# Patient Record
Sex: Female | Born: 1965 | Race: Black or African American | Hispanic: No | Marital: Married | Smoking: Never smoker
Health system: Southern US, Community
[De-identification: ages and names within clinical notes are randomized; demographics above are authoritative.]

## PROBLEM LIST (undated history)

## (undated) DIAGNOSIS — I10 Essential (primary) hypertension: Secondary | ICD-10-CM

## (undated) DIAGNOSIS — E785 Hyperlipidemia, unspecified: Secondary | ICD-10-CM

## (undated) DIAGNOSIS — Z8041 Family history of malignant neoplasm of ovary: Secondary | ICD-10-CM

## (undated) DIAGNOSIS — K219 Gastro-esophageal reflux disease without esophagitis: Secondary | ICD-10-CM

## (undated) DIAGNOSIS — Z803 Family history of malignant neoplasm of breast: Secondary | ICD-10-CM

## (undated) DIAGNOSIS — Z800 Family history of malignant neoplasm of digestive organs: Secondary | ICD-10-CM

## (undated) DIAGNOSIS — J45909 Unspecified asthma, uncomplicated: Secondary | ICD-10-CM

## (undated) DIAGNOSIS — D649 Anemia, unspecified: Secondary | ICD-10-CM

## (undated) HISTORY — PX: ABDOMINAL HYSTERECTOMY: SHX81

## (undated) HISTORY — DX: Family history of malignant neoplasm of breast: Z80.3

## (undated) HISTORY — DX: Unspecified asthma, uncomplicated: J45.909

## (undated) HISTORY — PX: BREAST SURGERY: SHX581

## (undated) HISTORY — DX: Hyperlipidemia, unspecified: E78.5

## (undated) HISTORY — DX: Family history of malignant neoplasm of ovary: Z80.41

## (undated) HISTORY — DX: Family history of malignant neoplasm of digestive organs: Z80.0

## (undated) HISTORY — DX: Essential (primary) hypertension: I10

## (undated) HISTORY — DX: Anemia, unspecified: D64.9

## (undated) HISTORY — PX: BREAST LUMPECTOMY: SHX2

## (undated) HISTORY — DX: Gastro-esophageal reflux disease without esophagitis: K21.9

---

## 2017-08-19 LAB — HM MAMMOGRAPHY

## 2018-03-06 ENCOUNTER — Ambulatory Visit: Payer: Self-pay | Admitting: Nurse Practitioner

## 2018-03-13 ENCOUNTER — Ambulatory Visit: Payer: Self-pay | Admitting: Nurse Practitioner

## 2018-03-20 ENCOUNTER — Encounter: Payer: Self-pay | Admitting: Nurse Practitioner

## 2018-03-20 ENCOUNTER — Ambulatory Visit: Payer: BLUE CROSS/BLUE SHIELD | Admitting: Nurse Practitioner

## 2018-03-20 VITALS — BP 128/82 | HR 75 | Temp 98.2°F | Ht 68.43 in | Wt 221.4 lb

## 2018-03-20 DIAGNOSIS — J4540 Moderate persistent asthma, uncomplicated: Secondary | ICD-10-CM

## 2018-03-20 DIAGNOSIS — J4541 Moderate persistent asthma with (acute) exacerbation: Secondary | ICD-10-CM

## 2018-03-20 DIAGNOSIS — J45909 Unspecified asthma, uncomplicated: Secondary | ICD-10-CM | POA: Insufficient documentation

## 2018-03-20 DIAGNOSIS — Z1211 Encounter for screening for malignant neoplasm of colon: Secondary | ICD-10-CM | POA: Diagnosis not present

## 2018-03-20 DIAGNOSIS — Z8041 Family history of malignant neoplasm of ovary: Secondary | ICD-10-CM | POA: Insufficient documentation

## 2018-03-20 DIAGNOSIS — Z90711 Acquired absence of uterus with remaining cervical stump: Secondary | ICD-10-CM | POA: Insufficient documentation

## 2018-03-20 MED ORDER — ALBUTEROL SULFATE HFA 108 (90 BASE) MCG/ACT IN AERS: 1.0000 | INHALATION_SPRAY | Freq: Four times a day (QID) | RESPIRATORY_TRACT | 1 refills | Status: DC | PRN

## 2018-03-20 MED ORDER — METHYLPREDNISOLONE 4 MG PO TBPK: ORAL_TABLET | ORAL | 0 refills | Status: DC

## 2018-03-20 MED ORDER — FLUTICASONE-SALMETEROL 250-50 MCG/DOSE IN AEPB: 1.0000 | INHALATION_SPRAY | Freq: Two times a day (BID) | RESPIRATORY_TRACT | 2 refills | Status: DC

## 2018-03-20 NOTE — Progress Notes (Signed)
Subjective:  Patient ID: Mariah Dean, female    DOB: 1965/02/26  Age: 53 y.o. MRN: [id number]  CC: Establish Care (CPE - July 2019, needs ref to GI for colonoscopy) and Asthma (needs albuterol refill)  Asthma  She complains of chest tightness, difficulty breathing, shortness of breath and wheezing. There is no cough, frequent throat clearing, hemoptysis, hoarse voice or sputum production. This is a chronic problem. The current episode started more than 1 year ago. The problem occurs intermittently. The problem has been waxing and waning. Pertinent negatives include no appetite change, chest pain, dyspnea on exertion, ear congestion, ear pain, fever, headaches, heartburn, malaise/fatigue, myalgias, nasal congestion, orthopnea, PND, postnasal drip, rhinorrhea, sneezing, sore throat, sweats, trouble swallowing or weight loss. Her symptoms are aggravated by pollen and change in weather. Her symptoms are alleviated by beta-agonist. She reports moderate improvement on treatment. Her past medical history is significant for asthma.  Albuterol use: every other day, 2-3times a day symbicort use in past  No GERD. No allergic rhinitis. Diagnosed with asthma as child.  S/p hysterectomy2010, ovaries and cervix still present.  Reviewed past Medical, Social and Family history today.  Outpatient Medications Prior to Visit  Medication Sig Dispense Refill  . albuterol (PROVENTIL HFA;VENTOLIN HFA) 108 (90 Base) MCG/ACT inhaler Inhale into the lungs every 6 (six) hours as needed for wheezing or shortness of breath.     No facility-administered medications prior to visit.     ROS See HPI  Objective:  BP 128/82   Pulse 75   Temp 98.2 F (36.8 C) (Oral)   Ht 5' 8.43" (1.738 m)   Wt 221 lb 6.4 oz (100.4 kg)   SpO2 98%   BMI 33.25 kg/m   BP Readings from Last 3 Encounters:  03/20/18 128/82    Wt Readings from Last 3 Encounters:  03/20/18 221 lb 6.4 oz (100.4 kg)   Peak Flow: 185, 190,  218ml/min  Physical Exam Vitals signs reviewed.  HENT:     Mouth/Throat:     Mouth: Mucous membranes are moist.     Pharynx: No posterior oropharyngeal erythema.  Cardiovascular:     Rate and Rhythm: Normal rate and regular rhythm.     Pulses: Normal pulses.     Heart sounds: Normal heart sounds.  Pulmonary:     Effort: Pulmonary effort is normal.     Breath sounds: Normal breath sounds.  Musculoskeletal:     Right lower leg: No edema.     Left lower leg: No edema.  Neurological:     Mental Status: She is alert and oriented to person, place, and time.     No results found for: WBC, HGB, HCT, PLT, GLUCOSE, CHOL, TRIG, HDL, LDLDIRECT, LDLCALC, ALT, AST, NA, K, CL, CREATININE, BUN, CO2, TSH, PSA, INR, GLUF, HGBA1C, MICROALBUR  Assessment & Plan:   Mariah Dean was seen today for establish care and asthma.  Diagnoses and all orders for this visit:  Moderate persistent asthma with acute exacerbation -     Peak flow meter -     Fluticasone-Salmeterol (ADVAIR DISKUS) 250-50 MCG/DOSE AEPB; Inhale 1 puff into the lungs 2 (two) times daily. Rinse mouth after each use -     albuterol (PROVENTIL HFA;VENTOLIN HFA) 108 (90 Base) MCG/ACT inhaler; Inhale 1-2 puffs into the lungs every 6 (six) hours as needed for wheezing or shortness of breath. -     methylPREDNISolone (MEDROL DOSEPAK) 4 MG TBPK tablet; Take as directed on package  Colon cancer screening -  Ambulatory referral to Gastroenterology   I have changed Mariah Dean's albuterol. I am also having her start on Fluticasone-Salmeterol and methylPREDNISolone.  Meds ordered this encounter  Medications  . Fluticasone-Salmeterol (ADVAIR DISKUS) 250-50 MCG/DOSE AEPB    Sig: Inhale 1 puff into the lungs 2 (two) times daily. Rinse mouth after each use    Dispense:  60 each    Refill:  2    Order Specific Question:   Supervising Provider    Answer:   Dianne Dun [[id number]]  . albuterol (PROVENTIL HFA;VENTOLIN HFA) 108 (90 Base)  MCG/ACT inhaler    Sig: Inhale 1-2 puffs into the lungs every 6 (six) hours as needed for wheezing or shortness of breath.    Dispense:  1 each    Refill:  1    Order Specific Question:   Supervising Provider    Answer:   Dianne Dun [[id number]]  . methylPREDNISolone (MEDROL DOSEPAK) 4 MG TBPK tablet    Sig: Take as directed on package    Dispense:  21 tablet    Refill:  0    Order Specific Question:   Supervising Provider    Answer:   Dianne Dun [[id number]]    Problem List Items Addressed This Visit      Respiratory   Moderate persistent asthma without complication - Primary   Relevant Medications   Fluticasone-Salmeterol (ADVAIR DISKUS) 250-50 MCG/DOSE AEPB   albuterol (PROVENTIL HFA;VENTOLIN HFA) 108 (90 Base) MCG/ACT inhaler   methylPREDNISolone (MEDROL DOSEPAK) 4 MG TBPK tablet    Other Visit Diagnoses    Colon cancer screening       Relevant Orders   Ambulatory referral to Gastroenterology      Follow-up: Return in about 4 weeks (around 04/17/2018) for asthma.  Alysia Penna, NP

## 2018-03-20 NOTE — Patient Instructions (Signed)
Complete oral prednisone prior to starting advair inhaler. Rinse mouth after each use of advair.  Use albuterol as prescribed  You will be contacted to schedule appt with GI for colonoscopy   Asthma Attack Prevention, Adult Although you may not be able to control the fact that you have asthma, you can take actions to prevent episodes of asthma (asthma attacks). These actions include:  Creating a written plan for managing and treating your asthma attacks (asthma action plan).  Monitoring your asthma.  Avoiding things that can irritate your airways or make your asthma symptoms worse (asthma triggers).  Taking your medicines as directed.  Acting quickly if you have signs or symptoms of an asthma attack. What are some ways to prevent an asthma attack? Create a plan Work with your health care provider to create an asthma action plan. This plan should include:  A list of your asthma triggers and how to avoid them.  A list of symptoms that you experience during an asthma attack.  Information about when to take medicine and how much medicine to take.  Information to help you understand your peak flow measurements.  Contact information for your health care providers.  Daily actions that you can take to control asthma. Monitor your asthma To monitor your asthma:  Use your peak flow meter every morning and every evening for 2-3 weeks. Record the results in a journal. A drop in your peak flow numbers on one or more days may mean that you are starting to have an asthma attack, even if you are not having symptoms.  When you have asthma symptoms, write them down in a journal.  Avoid asthma triggers Work with your health care provider to find out what your asthma triggers are. This can be done by:  Being tested for allergies.  Keeping a journal that notes when asthma attacks occur and what may have contributed to them.  Asking your health care provider whether other medical  conditions make your asthma worse. Common asthma triggers include:  Dust.  Smoke. This includes campfire smoke and secondhand smoke from tobacco products.  Pet dander.  Trees, grasses or pollens.  Very cold, dry, or humid air.  Mold.  Foods that contain high amounts of sulfites.  Strong smells.  Engine exhaust and air pollution.  Aerosol sprays and fumes from household cleaners.  Household pests and their droppings, including dust mites and cockroaches.  Certain medicines, including NSAIDs. Once you have determined your asthma triggers, take steps to avoid them. Depending on your triggers, you may be able to reduce the chance of an asthma attack by:  Keeping your home clean. Have someone dust and vacuum your home for you 1 or 2 times a week. If possible, have them use a high-efficiency particulate arrestance (HEPA) vacuum.  Washing your sheets weekly in hot water.  Using allergy-proof mattress covers and casings on your bed.  Keeping pets out of your home.  Taking care of mold and water problems in your home.  Avoiding areas where people smoke.  Avoiding using strong perfumes or odor sprays.  Avoid spending a lot of time outdoors when pollen counts are high and on very windy days.  Talking with your health care provider before stopping or starting any new medicines. Medicines Take over-the-counter and prescription medicines only as told by your health care provider. Many asthma attacks can be prevented by carefully following your medicine schedule. Taking your medicines correctly is especially important when you cannot avoid certain asthma triggers.  Even if you are doing well, do not stop taking your medicine and do not take less medicine. Act quickly If an asthma attack happens, acting quickly can decrease how severe it is and how long it lasts. Take these actions:  Pay attention to your symptoms. If you are coughing, wheezing, or having difficulty breathing, do not  wait to see if your symptoms go away on their own. Follow your asthma action plan.  If you have followed your asthma action plan and your symptoms are not improving, call your health care provider or seek immediate medical care at the nearest hospital. It is important to write down how often you need to use your fast-acting rescue inhaler. You can track how often you use an inhaler in your journal. If you are using your rescue inhaler more often, it may mean that your asthma is not under control. Adjusting your asthma treatment plan may help you to prevent future asthma attacks and help you to gain better control of your condition. How can I prevent an asthma attack when I exercise? Exercise is a common asthma trigger. To prevent asthma attacks during exercise:  Follow advice from your health care provider about whether you should use your fast-acting inhaler before exercising. Many people with asthma experience exercise-induced bronchoconstriction (EIB). This condition often worsens during vigorous exercise in cold, humid, or dry environments. Usually, people with EIB can stay very active by using a fast-acting inhaler before exercising.  Avoid exercising outdoors in very cold or humid weather.  Avoid exercising outdoors when pollen counts are high.  Warm up and cool down when exercising.  Stop exercising right away if asthma symptoms start. Consider taking part in exercises that are less likely to cause asthma symptoms such as:  Indoor swimming.  Biking.  Walking.  Hiking.  Playing football. This information is not intended to replace advice given to you by your health care provider. Make sure you discuss any questions you have with your health care provider. Document Released: 12/19/2008 Document Revised: 09/01/2015 Document Reviewed: 06/17/2015 Elsevier Interactive Patient Education  2019 [employer].

## 2018-03-23 ENCOUNTER — Encounter: Payer: Self-pay | Admitting: Nurse Practitioner

## 2018-03-23 NOTE — Progress Notes (Signed)
Abstracted result and sent to scan  

## 2018-04-14 ENCOUNTER — Encounter (INDEPENDENT_AMBULATORY_CARE_PROVIDER_SITE_OTHER): Payer: BLUE CROSS/BLUE SHIELD | Admitting: Nurse Practitioner

## 2018-04-14 DIAGNOSIS — J4540 Moderate persistent asthma, uncomplicated: Secondary | ICD-10-CM

## 2018-04-15 MED ORDER — CETIRIZINE HCL 10 MG PO TABS: 10.0000 mg | ORAL_TABLET | Freq: Every day | ORAL | 0 refills | Status: DC

## 2018-04-15 NOTE — Telephone Encounter (Signed)
Cumulative time during 7-day interval: , there was not an associated office visit for this concern within a 7 day period. Patient did provide consent for services prior to services given. Names of all persons present for services: Alysia Penna, NP, Chief complaint: asthma exacerbation History, background, results pertinent:  Past Medical History:  Diagnosis Date  . Anemia   . Hyperlipidemia   . Hypertension    A/P/next steps:  Diagnoses and all orders for this visit:  Moderate persistent asthma without complication -     cetirizine (ZYRTEC) 10 MG tablet; Take 1 tablet (10 mg total) by mouth daily.   add zyrtec to use of advair and albuterol.

## 2018-04-30 ENCOUNTER — Ambulatory Visit: Payer: BLUE CROSS/BLUE SHIELD | Admitting: Nurse Practitioner

## 2018-05-05 ENCOUNTER — Telehealth: Payer: Self-pay | Admitting: Nurse Practitioner

## 2018-05-05 NOTE — Telephone Encounter (Signed)
Mariah Dean please advise, ov with you first?   Left vm for the pt to call back.     Copied from CRM [id number]. Topic: Quick Communication - See Telephone Encounter >> May 05, 2018 11:10 AM Mariah Dean wrote: CRM for notification. See Telephone encounter for: 05/05/18. [employer] is requiring staff wear mask for the next 14 days/ She has to work/ but with her asthma she can not wear it, using inhaler constantly/ the LOA company (Mariah Dean) with Mariah Dean has given her papers for her provider to process and affirm that she needs to be taken out of work for the next 14 days. Pt is faxing this to Dr Mariah Dean today 4/21 to office fax.

## 2018-05-06 ENCOUNTER — Encounter: Payer: Self-pay | Admitting: Nurse Practitioner

## 2018-05-06 ENCOUNTER — Ambulatory Visit (INDEPENDENT_AMBULATORY_CARE_PROVIDER_SITE_OTHER): Payer: BLUE CROSS/BLUE SHIELD | Admitting: Nurse Practitioner

## 2018-05-06 VITALS — BP 140/86 | Temp 97.0°F | Ht 68.43 in | Wt 230.0 lb

## 2018-05-06 DIAGNOSIS — R030 Elevated blood-pressure reading, without diagnosis of hypertension: Secondary | ICD-10-CM

## 2018-05-06 DIAGNOSIS — J4531 Mild persistent asthma with (acute) exacerbation: Secondary | ICD-10-CM | POA: Diagnosis not present

## 2018-05-06 MED ORDER — MONTELUKAST SODIUM 10 MG PO TABS: 10.0000 mg | ORAL_TABLET | Freq: Every day | ORAL | 3 refills | Status: DC

## 2018-05-06 MED ORDER — FLUTICASONE-SALMETEROL 250-50 MCG/DOSE IN AEPB: 2.0000 | INHALATION_SPRAY | Freq: Two times a day (BID) | RESPIRATORY_TRACT | 2 refills | Status: DC

## 2018-05-06 MED ORDER — FLUTICASONE-SALMETEROL 500-50 MCG/DOSE IN AEPB: 1.0000 | INHALATION_SPRAY | Freq: Two times a day (BID) | RESPIRATORY_TRACT | 2 refills | Status: DC

## 2018-05-06 NOTE — Telephone Encounter (Signed)
Patient calling back to check status. She is requesting a call back.

## 2018-05-06 NOTE — Progress Notes (Signed)
Virtual Visit via Video Note  I connected with Mariah Dean on 05/06/18 at  3:00 PM EDT by a video enabled telemedicine application and verified that I am speaking with the correct person using two identifiers.   I discussed the limitations of evaluation and management by telemedicine and the availability of in person appointments. The patient expressed understanding and agreed to proceed.  CC: LOA paper work consult--pt has to weare mask at work for 14 days (mandatory) but pt is unable to do so due to her Ashthma. got the form in the office.   Asthma: pt mention get SOB on exertion--going on for a while but it is getting worse in the last month--FYI--pt will recheck her BP again.  History of Present Illness: Asthma  She complains of chest tightness, difficulty breathing, shortness of breath and wheezing. There is no cough, frequent throat clearing, hemoptysis, hoarse voice or sputum production. This is a chronic problem. The current episode started more than 1 month ago. The problem occurs constantly. The problem has been unchanged. Associated symptoms include dyspnea on exertion. Pertinent negatives include no chest pain, fever, heartburn, malaise/fatigue, myalgias, nasal congestion, orthopnea, PND, postnasal drip, rhinorrhea, sweats or weight loss. Her symptoms are aggravated by any activity and pollen (use of surgical mask). Her symptoms are alleviated by beta-agonist. She reports moderate improvement on treatment. Her past medical history is significant for asthma.  use of Surgical Mask required at workplace due to current COVID-19 pandemic. She reports this is making symptoms worse and need to constantly touch her face and remove mask. She works with public at [employer].  Observations/Objective: Peak Flow reading: 280, 360, 313ml/min.  Physical Exam  Constitutional: She is oriented to person, place, and time. No distress.  Neck: Normal range of motion. Neck supple.  Pulmonary/Chest:  Effort normal.  Neurological: She is alert and oriented to person, place, and time.  Psychiatric: She has a normal mood and affect. Her behavior is normal. Thought content normal.  Vitals reviewed.  Assessment and Plan: Brynlie was seen today for follow-up and shortness of breath.  Diagnoses and all orders for this visit:  Mild persistent asthma with acute exacerbation -     Discontinue: Fluticasone-Salmeterol (ADVAIR DISKUS) 250-50 MCG/DOSE AEPB; Inhale 2 puffs into the lungs 2 (two) times daily. Rinse mouth after each use -     montelukast (SINGULAIR) 10 MG tablet; Take 1 tablet (10 mg total) by mouth at bedtime. -     Fluticasone-Salmeterol (ADVAIR) 500-50 MCG/DOSE AEPB; Inhale 1 puff into the lungs 2 (two) times daily.  Elevated BP without diagnosis of hypertension   Follow Up Instructions: Changed advair dose 250/50 to 500/58mcg: 1puff BID, rinse mouth after each use. Use albuterol 2puffs every 6hrs as needed. Start montelukast at HS. Monitor BP once a day in AM. Call office if BP>140/80 for more than 3 consecutive days. F/up in 2weeks.   I discussed the assessment and treatment plan with the patient. The patient was provided an opportunity to ask questions and all were answered. The patient agreed with the plan and demonstrated an understanding of the instructions.   The patient was advised to call back or seek an in-person evaluation if the symptoms worsen or if the condition fails to improve as anticipated.   Alysia Penna, NP

## 2018-05-06 NOTE — Patient Instructions (Addendum)
Will complete work form to be out of work 05/06/2018 to 05/20/2018 in relation to Acute Asthma exacerbation  Changed advair dose 250/50 to 500/43mcg: 1puff BID, rinse mouth after each use. Use albuterol 2puffs every 6hrs as needed. Start montelukast at HS.  Monitor BP once a day in AM. Call office if BP>140/80 for more than 3 consecutive days.  F/up in 2weeks.

## 2018-05-06 NOTE — Telephone Encounter (Signed)
appt made today with [facility].  

## 2018-05-20 ENCOUNTER — Other Ambulatory Visit: Payer: Self-pay

## 2018-05-20 ENCOUNTER — Encounter: Payer: Self-pay | Admitting: Nurse Practitioner

## 2018-05-20 ENCOUNTER — Ambulatory Visit (INDEPENDENT_AMBULATORY_CARE_PROVIDER_SITE_OTHER): Payer: BLUE CROSS/BLUE SHIELD | Admitting: Nurse Practitioner

## 2018-05-20 VITALS — BP 146/86 | HR 69 | Ht 68.43 in

## 2018-05-20 DIAGNOSIS — R030 Elevated blood-pressure reading, without diagnosis of hypertension: Secondary | ICD-10-CM

## 2018-05-20 DIAGNOSIS — I10 Essential (primary) hypertension: Secondary | ICD-10-CM | POA: Insufficient documentation

## 2018-05-20 DIAGNOSIS — J4521 Mild intermittent asthma with (acute) exacerbation: Secondary | ICD-10-CM

## 2018-05-20 MED ORDER — AEROCHAMBER PLUS FLO-VU LARGE MISC: 1.0000 | Freq: Two times a day (BID) | 0 refills | Status: DC

## 2018-05-20 MED ORDER — GUAIFENESIN-DM 100-10 MG/5ML PO SYRP: 5.0000 mL | ORAL_SOLUTION | ORAL | 0 refills | Status: DC | PRN

## 2018-05-20 MED ORDER — CETIRIZINE HCL 10 MG PO TABS: 10.0000 mg | ORAL_TABLET | Freq: Every day | ORAL | 0 refills | Status: DC

## 2018-05-20 NOTE — Progress Notes (Signed)
Virtual Visit via Video Note  I connected with Mariah Dean on 05/20/18 at  8:15 AM EDT by a video enabled telemedicine application and verified that I am speaking with the correct person using two identifiers.  Location: Patient: Home Provider: Office   I discussed the limitations of evaluation and management by telemedicine and the availability of in person appointments. The patient expressed understanding and agreed to proceed.  CC: f/up on BP and Asthma.  History of Present Illness: Elevated BP: Elevated today Home BP varies fron 110s/80s -130s/80s   Asthma: improved coughing Use of albuterol once a day. Use of advair BID but leaves a bad taste in her mouth.  Review of Systems  Constitutional: Negative.   HENT: Positive for congestion and sore throat.   Respiratory: Positive for cough and shortness of breath. Negative for wheezing.   Cardiovascular: Negative.   Gastrointestinal: Negative.    Observations/Objective: Physical Exam  Constitutional: She is oriented to person, place, and time. No distress.  Pulmonary/Chest: Effort normal.  Neurological: She is alert and oriented to person, place, and time.  Psychiatric: She has a normal mood and affect. Her behavior is normal.  Vitals reviewed.   Assessment and Plan: Mariah Dean was seen today for follow-up and cough.  Diagnoses and all orders for this visit:  Mild intermittent asthma with acute exacerbation -     Spacer/Aero-Holding Chambers (AEROCHAMBER PLUS FLO-VU LARGE) MISC; 1 each by Other route 2 (two) times daily. -     guaiFENesin-dextromethorphan (ROBITUSSIN DM) 100-10 MG/5ML syrup; Take 5 mLs by mouth every 4 (four) hours as needed for cough. -     cetirizine (ZYRTEC) 10 MG tablet; Take 1 tablet (10 mg total) by mouth daily.  Elevated BP without diagnosis of hypertension   Follow Up Instructions: Ok to return too work 05/27/2018.Use spacer with inhaler to ensure adequate medication delivery into  lungs. Continue to monitor BP 2-3times a week. Call office if BP persistently >150/80 Maintain dash diet and daily walking for exercise.   I discussed the assessment and treatment plan with the patient. The patient was provided an opportunity to ask questions and all were answered. The patient agreed with the plan and demonstrated an understanding of the instructions.   The patient was advised to call back or seek an in-person evaluation if the symptoms worsen or if the condition fails to improve as anticipated.   Mariah Penna, NP

## 2018-05-20 NOTE — Patient Instructions (Addendum)
Use spacer with inhaler to ensure adequate medication delivery into lungs. Work note extended for another week. Continue to monitor BP 2-3times a week. Call office if BP persistently >150/80 Maintain dash diet and daily walking for exercise.  DASH Eating Plan DASH stands for "Dietary Approaches to Stop Hypertension." The DASH eating plan is a healthy eating plan that has been shown to reduce high blood pressure (hypertension). It may also reduce your risk for type 2 diabetes, heart disease, and stroke. The DASH eating plan may also help with weight loss. What are tips for following this plan?  General guidelines  Avoid eating more than 2,300 mg (milligrams) of salt (sodium) a day. If you have hypertension, you may need to reduce your sodium intake to 1,500 mg a day.  Limit alcohol intake to no more than 1 drink a day for nonpregnant women and 2 drinks a day for men. One drink equals 12 oz of beer, 5 oz of wine, or 1 oz of hard liquor.  Work with your health care provider to maintain a healthy body weight or to lose weight. Ask what an ideal weight is for you.  Get at least 30 minutes of exercise that causes your heart to beat faster (aerobic exercise) most days of the week. Activities may include walking, swimming, or biking.  Work with your health care provider or diet and nutrition specialist (dietitian) to adjust your eating plan to your individual calorie needs. Reading food labels   Check food labels for the amount of sodium per serving. Choose foods with less than 5 percent of the Daily Value of sodium. Generally, foods with less than 300 mg of sodium per serving fit into this eating plan.  To find whole grains, look for the word "whole" as the first word in the ingredient list. Shopping  Buy products labeled as "low-sodium" or "no salt added."  Buy fresh foods. Avoid canned foods and premade or frozen meals. Cooking  Avoid adding salt when cooking. Use salt-free seasonings  or herbs instead of table salt or sea salt. Check with your health care provider or pharmacist before using salt substitutes.  Do not fry foods. Cook foods using healthy methods such as baking, boiling, grilling, and broiling instead.  Cook with heart-healthy oils, such as olive, canola, soybean, or sunflower oil. Meal planning  Eat a balanced diet that includes: ? 5 or more servings of fruits and vegetables each day. At each meal, try to fill half of your plate with fruits and vegetables. ? Up to 6-8 servings of whole grains each day. ? Less than 6 oz of lean meat, poultry, or fish each day. A 3-oz serving of meat is about the same size as a deck of cards. One egg equals 1 oz. ? 2 servings of low-fat dairy each day. ? A serving of nuts, seeds, or beans 5 times each week. ? Heart-healthy fats. Healthy fats called Omega-3 fatty acids are found in foods such as flaxseeds and coldwater fish, like sardines, salmon, and mackerel.  Limit how much you eat of the following: ? Canned or prepackaged foods. ? Food that is high in trans fat, such as fried foods. ? Food that is high in saturated fat, such as fatty meat. ? Sweets, desserts, sugary drinks, and other foods with added sugar. ? Full-fat dairy products.  Do not salt foods before eating.  Try to eat at least 2 vegetarian meals each week.  Eat more home-cooked food and less restaurant, buffet, and fast  food.  When eating at a restaurant, ask that your food be prepared with less salt or no salt, if possible. What foods are recommended? The items listed may not be a complete list. Talk with your dietitian about what dietary choices are best for you. Grains Whole-grain or whole-wheat bread. Whole-grain or whole-wheat pasta. Brown rice. Orpah Cobb. Bulgur. Whole-grain and low-sodium cereals. Pita bread. Low-fat, low-sodium crackers. Whole-wheat flour tortillas. Vegetables Fresh or frozen vegetables (raw, steamed, roasted, or grilled).  Low-sodium or reduced-sodium tomato and vegetable juice. Low-sodium or reduced-sodium tomato sauce and tomato paste. Low-sodium or reduced-sodium canned vegetables. Fruits All fresh, dried, or frozen fruit. Canned fruit in natural juice (without added sugar). Meat and other protein foods Skinless chicken or [place]. Ground chicken or [place]. Pork with fat trimmed off. Fish and seafood. Egg whites. Dried beans, peas, or lentils. Unsalted nuts, nut butters, and seeds. Unsalted canned beans. Lean cuts of beef with fat trimmed off. Low-sodium, lean deli meat. Dairy Low-fat (1%) or fat-free (skim) milk. Fat-free, low-fat, or reduced-fat cheeses. Nonfat, low-sodium ricotta or cottage cheese. Low-fat or nonfat yogurt. Low-fat, low-sodium cheese. Fats and oils Soft margarine without trans fats. Vegetable oil. Low-fat, reduced-fat, or light mayonnaise and salad dressings (reduced-sodium). Canola, safflower, olive, soybean, and sunflower oils. Avocado. Seasoning and other foods Herbs. Spices. Seasoning mixes without salt. Unsalted popcorn and pretzels. Fat-free sweets. What foods are not recommended? The items listed may not be a complete list. Talk with your dietitian about what dietary choices are best for you. Grains Baked goods made with fat, such as croissants, muffins, or some breads. Dry pasta or rice meal packs. Vegetables Creamed or fried vegetables. Vegetables in a cheese sauce. Regular canned vegetables (not low-sodium or reduced-sodium). Regular canned tomato sauce and paste (not low-sodium or reduced-sodium). Regular tomato and vegetable juice (not low-sodium or reduced-sodium). Rosita Fire. Olives. Fruits Canned fruit in a light or heavy syrup. Fried fruit. Fruit in cream or butter sauce. Meat and other protein foods Fatty cuts of meat. Ribs. Fried meat. Tomasa Blase. Sausage. Bologna and other processed lunch meats. Salami. Fatback. Hotdogs. Bratwurst. Salted nuts and seeds. Canned beans with added  salt. Canned or smoked fish. Whole eggs or egg yolks. Chicken or [place] with skin. Dairy Whole or 2% milk, cream, and half-and-half. Whole or full-fat cream cheese. Whole-fat or sweetened yogurt. Full-fat cheese. Nondairy creamers. Whipped toppings. Processed cheese and cheese spreads. Fats and oils Butter. Stick margarine. Lard. Shortening. Ghee. Bacon fat. Tropical oils, such as coconut, palm kernel, or palm oil. Seasoning and other foods Salted popcorn and pretzels. Onion salt, garlic salt, seasoned salt, table salt, and sea salt. Worcestershire sauce. Tartar sauce. Barbecue sauce. Teriyaki sauce. Soy sauce, including reduced-sodium. Steak sauce. Canned and packaged gravies. Fish sauce. Oyster sauce. Cocktail sauce. Horseradish that you find on the shelf. Ketchup. Mustard. Meat flavorings and tenderizers. Bouillon cubes. Hot sauce and Tabasco sauce. Premade or packaged marinades. Premade or packaged taco seasonings. Relishes. Regular salad dressings. Where to find more information:  National Heart, Lung, and Blood Institute: PopSteam.is  American Heart Association: www.heart.org Summary  The DASH eating plan is a healthy eating plan that has been shown to reduce high blood pressure (hypertension). It may also reduce your risk for type 2 diabetes, heart disease, and stroke.  With the DASH eating plan, you should limit salt (sodium) intake to 2,300 mg a day. If you have hypertension, you may need to reduce your sodium intake to 1,500 mg a day.  When on the DASH  eating plan, aim to eat more fresh fruits and vegetables, whole grains, lean proteins, low-fat dairy, and heart-healthy fats.  Work with your health care provider or diet and nutrition specialist (dietitian) to adjust your eating plan to your individual calorie needs. This information is not intended to replace advice given to you by your health care provider. Make sure you discuss any questions you have with your health care  provider. Document Released: 12/20/2010 Document Revised: 12/25/2015 Document Reviewed: 12/25/2015 Elsevier Interactive Patient Education  2019 [employer].

## 2018-05-24 ENCOUNTER — Encounter: Payer: Self-pay | Admitting: Nurse Practitioner

## 2018-05-26 ENCOUNTER — Telehealth: Payer: Self-pay | Admitting: Nurse Practitioner

## 2018-05-26 NOTE — Telephone Encounter (Signed)
Copied from CRM [id number]. Topic: Quick Communication - See Telephone Encounter >> May 26, 2018  3:54 PM Jay Schlichter wrote: CRM for notification. See Telephone encounter for: 05/26/18. Pt called - she would like to extend her time off work,  she is not feeling better.  Cb is [phone]

## 2018-05-26 NOTE — Telephone Encounter (Signed)
Mariah Dean please advise 

## 2018-05-26 NOTE — Telephone Encounter (Signed)
Do not extend work note. She need to schedule appt

## 2018-05-27 ENCOUNTER — Encounter: Payer: Self-pay | Admitting: Nurse Practitioner

## 2018-05-27 ENCOUNTER — Ambulatory Visit (INDEPENDENT_AMBULATORY_CARE_PROVIDER_SITE_OTHER): Payer: BLUE CROSS/BLUE SHIELD | Admitting: Nurse Practitioner

## 2018-05-27 VITALS — Ht 68.43 in

## 2018-05-27 DIAGNOSIS — R0981 Nasal congestion: Secondary | ICD-10-CM

## 2018-05-27 MED ORDER — OXYMETAZOLINE HCL 0.05 % NA SOLN: 1.0000 | Freq: Two times a day (BID) | NASAL | 0 refills | Status: DC

## 2018-05-27 MED ORDER — FLUTICASONE PROPIONATE 50 MCG/ACT NA SUSP: 2.0000 | Freq: Every day | NASAL | 2 refills | Status: DC

## 2018-05-27 NOTE — Progress Notes (Signed)
Virtual Visit via Video Note  I connected with Mariah Dean on 05/27/18 at  2:30 PM EDT by a video enabled telemedicine application and verified that I am speaking with the correct person using two identifiers.  Location: Patient: home Provider: office   I discussed the limitations of evaluation and management by telemedicine and the availability of in person appointments. The patient expressed understanding and agreed to proceed.  CC: pt still nto better from last ov,sore throat,runny and stop up nose/request extended out of work note? no vital sign to share today.  History of Present Illness: Sinus Problem  This is a new problem. The current episode started in the past 7 days (1week ago). The problem is unchanged. There has been no fever. Associated symptoms include congestion, headaches, sinus pressure and a sore throat. Pertinent negatives include no chills, coughing, hoarse voice, neck pain, shortness of breath, sneezing or swollen glands. Treatments tried: and antihistamine. The treatment provided mild relief.  no clear nasal drainage. No known sick contact, no travel.  Observations/Objective: Physical Exam  Constitutional: She is oriented to person, place, and time. No distress.  Pulmonary/Chest: Effort normal.  Neurological: She is alert and oriented to person, place, and time.   Assessment and Plan: Mariah Dean was seen today for sore throat.  Diagnoses and all orders for this visit:  Sinus congestion -     fluticasone (FLONASE) 50 MCG/ACT nasal spray; Place 2 sprays into both nostrils daily. -     oxymetazoline (AFRIN NASAL SPRAY) 0.05 % nasal spray; Place 1 spray into both nostrils 2 (two) times daily. Use only for 3days, then stop   Follow Up Instructions: Continue mucinex and zyrtec. Flonase and Afrin use: apply 1spray of afrin in each nare, wait , then apply 2sprays of flonase in each nare. Use both nasal spray consecutively x 3days, then flonase only for at  least 14days. Encourage adequate oral hydration. Ok to return to work 06/03/2018 to complete 2weeks period during symptom onset.   I discussed the assessment and treatment plan with the patient. The patient was provided an opportunity to ask questions and all were answered. The patient agreed with the plan and demonstrated an understanding of the instructions.   The patient was advised to call back or seek an in-person evaluation if the symptoms worsen or if the condition fails to improve as anticipated.   Alysia Penna, NP

## 2018-05-27 NOTE — Telephone Encounter (Signed)
appt set

## 2018-05-27 NOTE — Patient Instructions (Addendum)
Flonase and Afrin use: apply 1spray of afrin in each nare, wait , then apply 2sprays of flonase in each nare. Use both nasal spray consecutively x 3days, then flonase only for at least 14days.  Encourage adequate oral hydration.  Ok to return to work 06/03/2018 to complete 2weeks period during symptom onset.

## 2018-06-01 ENCOUNTER — Encounter: Payer: Self-pay | Admitting: Nurse Practitioner

## 2018-06-23 ENCOUNTER — Encounter: Payer: Self-pay | Admitting: Nurse Practitioner

## 2018-06-29 ENCOUNTER — Ambulatory Visit: Payer: BLUE CROSS/BLUE SHIELD | Admitting: Nurse Practitioner

## 2018-07-03 ENCOUNTER — Ambulatory Visit: Payer: BC Managed Care – PPO | Admitting: Nurse Practitioner

## 2018-07-07 ENCOUNTER — Encounter: Payer: Self-pay | Admitting: Nurse Practitioner

## 2018-07-09 ENCOUNTER — Telehealth: Payer: Self-pay | Admitting: Nurse Practitioner

## 2018-07-09 NOTE — Telephone Encounter (Signed)
Questions for Screening COVID-19  Symptom onset: n/a  Travel or Contacts: no  During this illness, did/does the patient experience any of the following symptoms? Fever >100.20F []   Yes [x]   No []   Unknown Subjective fever (felt feverish) []   Yes [x]   No []   Unknown Chills []   Yes [x]   No []   Unknown Muscle aches (myalgia) []   Yes [x]   No []   Unknown Runny nose (rhinorrhea) []   Yes [x]   No []   Unknown Sore throat []   Yes [x]   No []   Unknown Cough (new onset or worsening of chronic cough) []   Yes [x]   No []   Unknown Shortness of breath (dyspnea) []   Yes [x]   No []   Unknown Nausea or vomiting []   Yes [x]   No []   Unknown Headache []   Yes [x]   No []   Unknown Abdominal pain  []   Yes [x]   No []   Unknown Diarrhea (?3 loose/looser than normal stools/24hr period) []   Yes [x]   No []   Unknown Other, specify:  Patient risk factors: Smoker? []   Current []   Former []   Never If female, currently pregnant? []   Yes []   No  Patient Active Problem List   Diagnosis Date Noted  . Elevated BP without diagnosis of hypertension 05/20/2018  . Asthma 03/20/2018  . S/P partial hysterectomy 03/20/2018  . Family hx of ovarian malignancy 03/20/2018    Plan:  []   High risk for COVID-19 with red flags go to ED (with CP, SOB, weak/lightheaded, or fever > 101.5). Call ahead.  []   High risk for COVID-19 but stable. Inform provider and coordinate time for [facility] visit.   []   No red flags but URI signs or symptoms okay for [facility] visit.

## 2018-07-10 ENCOUNTER — Other Ambulatory Visit: Payer: Self-pay

## 2018-07-10 ENCOUNTER — Ambulatory Visit: Payer: BC Managed Care – PPO | Admitting: Nurse Practitioner

## 2018-07-10 ENCOUNTER — Encounter: Payer: Self-pay | Admitting: Nurse Practitioner

## 2018-07-10 VITALS — BP 140/84 | HR 71 | Temp 98.6°F | Ht 68.43 in | Wt 223.8 lb

## 2018-07-10 DIAGNOSIS — R030 Elevated blood-pressure reading, without diagnosis of hypertension: Secondary | ICD-10-CM | POA: Diagnosis not present

## 2018-07-10 DIAGNOSIS — Z8041 Family history of malignant neoplasm of ovary: Secondary | ICD-10-CM | POA: Diagnosis not present

## 2018-07-10 DIAGNOSIS — N951 Menopausal and female climacteric states: Secondary | ICD-10-CM | POA: Diagnosis not present

## 2018-07-10 DIAGNOSIS — Z1211 Encounter for screening for malignant neoplasm of colon: Secondary | ICD-10-CM | POA: Diagnosis not present

## 2018-07-10 MED ORDER — PAROXETINE HCL 20 MG PO TABS: 20.0000 mg | ORAL_TABLET | Freq: Every day | ORAL | 1 refills | Status: DC

## 2018-07-10 NOTE — Patient Instructions (Addendum)
I instructed pt to start 1/2 tablet once daily for 1 week and then increase to a full tablet once daily on week two as tolerated.   We discussed common side effects such as nausea, drowsiness and weight gain.  Also discussed rare but serious side effect of suicide ideation.  She is instructed to discontinue medication go directly to ED if this occurs.  Pt verbalizes understanding.   Plan follow up in 1 month to evaluate progress.    Maintain DASH diet. Start BP medication if no improvement in BP in 39month.  You will be contacted to schedule appt with oncology and GI.   DASH Eating Plan DASH stands for "Dietary Approaches to Stop Hypertension." The DASH eating plan is a healthy eating plan that has been shown to reduce high blood pressure (hypertension). It may also reduce your risk for type 2 diabetes, heart disease, and stroke. The DASH eating plan may also help with weight loss. What are tips for following this plan?  General guidelines  Avoid eating more than 2,300 mg (milligrams) of salt (sodium) a day. If you have hypertension, you may need to reduce your sodium intake to 1,500 mg a day.  Limit alcohol intake to no more than 1 drink a day for nonpregnant women and 2 drinks a day for men. One drink equals 12 oz of beer, 5 oz of wine, or 1 oz of hard liquor.  Work with your health care provider to maintain a healthy body weight or to lose weight. Ask what an ideal weight is for you.  Get at least 30 minutes of exercise that causes your heart to beat faster (aerobic exercise) most days of the week. Activities may include walking, swimming, or biking.  Work with your health care provider or diet and nutrition specialist (dietitian) to adjust your eating plan to your individual calorie needs. Reading food labels   Check food labels for the amount of sodium per serving. Choose foods with less than 5 percent of the Daily Value of sodium. Generally, foods with less than 300 mg of sodium per  serving fit into this eating plan.  To find whole grains, look for the word "whole" as the first word in the ingredient list. Shopping  Buy products labeled as "low-sodium" or "no salt added."  Buy fresh foods. Avoid canned foods and premade or frozen meals. Cooking  Avoid adding salt when cooking. Use salt-free seasonings or herbs instead of table salt or sea salt. Check with your health care provider or pharmacist before using salt substitutes.  Do not fry foods. Cook foods using healthy methods such as baking, boiling, grilling, and broiling instead.  Cook with heart-healthy oils, such as olive, canola, soybean, or sunflower oil. Meal planning  Eat a balanced diet that includes: ? 5 or more servings of fruits and vegetables each day. At each meal, try to fill half of your plate with fruits and vegetables. ? Up to 6-8 servings of whole grains each day. ? Less than 6 oz of lean meat, poultry, or fish each day. A 3-oz serving of meat is about the same size as a deck of cards. One egg equals 1 oz. ? 2 servings of low-fat dairy each day. ? A serving of nuts, seeds, or beans 5 times each week. ? Heart-healthy fats. Healthy fats called Omega-3 fatty acids are found in foods such as flaxseeds and coldwater fish, like sardines, salmon, and mackerel.  Limit how much you eat of the following: ? Canned  or prepackaged foods. ? Food that is high in trans fat, such as fried foods. ? Food that is high in saturated fat, such as fatty meat. ? Sweets, desserts, sugary drinks, and other foods with added sugar. ? Full-fat dairy products.  Do not salt foods before eating.  Try to eat at least 2 vegetarian meals each week.  Eat more home-cooked food and less restaurant, buffet, and fast food.  When eating at a restaurant, ask that your food be prepared with less salt or no salt, if possible. What foods are recommended? The items listed may not be a complete list. Talk with your dietitian about  what dietary choices are best for you. Grains Whole-grain or whole-wheat bread. Whole-grain or whole-wheat pasta. Brown rice. Modena Morrow. Bulgur. Whole-grain and low-sodium cereals. Pita bread. Low-fat, low-sodium crackers. Whole-wheat flour tortillas. Vegetables Fresh or frozen vegetables (raw, steamed, roasted, or grilled). Low-sodium or reduced-sodium tomato and vegetable juice. Low-sodium or reduced-sodium tomato sauce and tomato paste. Low-sodium or reduced-sodium canned vegetables. Fruits All fresh, dried, or frozen fruit. Canned fruit in natural juice (without added sugar). Meat and other protein foods Skinless chicken or [place]. Ground chicken or [place]. Pork with fat trimmed off. Fish and seafood. Egg whites. Dried beans, peas, or lentils. Unsalted nuts, nut butters, and seeds. Unsalted canned beans. Lean cuts of beef with fat trimmed off. Low-sodium, lean deli meat. Dairy Low-fat (1%) or fat-free (skim) milk. Fat-free, low-fat, or reduced-fat cheeses. Nonfat, low-sodium ricotta or cottage cheese. Low-fat or nonfat yogurt. Low-fat, low-sodium cheese. Fats and oils Soft margarine without trans fats. Vegetable oil. Low-fat, reduced-fat, or light mayonnaise and salad dressings (reduced-sodium). Canola, safflower, olive, soybean, and sunflower oils. Avocado. Seasoning and other foods Herbs. Spices. Seasoning mixes without salt. Unsalted popcorn and pretzels. Fat-free sweets. What foods are not recommended? The items listed may not be a complete list. Talk with your dietitian about what dietary choices are best for you. Grains Baked goods made with fat, such as croissants, muffins, or some breads. Dry pasta or rice meal packs. Vegetables Creamed or fried vegetables. Vegetables in a cheese sauce. Regular canned vegetables (not low-sodium or reduced-sodium). Regular canned tomato sauce and paste (not low-sodium or reduced-sodium). Regular tomato and vegetable juice (not low-sodium or  reduced-sodium). Angie Fava. Olives. Fruits Canned fruit in a light or heavy syrup. Fried fruit. Fruit in cream or butter sauce. Meat and other protein foods Fatty cuts of meat. Ribs. Fried meat. Berniece Salines. Sausage. Bologna and other processed lunch meats. Salami. Fatback. Hotdogs. Bratwurst. Salted nuts and seeds. Canned beans with added salt. Canned or smoked fish. Whole eggs or egg yolks. Chicken or [place] with skin. Dairy Whole or 2% milk, cream, and half-and-half. Whole or full-fat cream cheese. Whole-fat or sweetened yogurt. Full-fat cheese. Nondairy creamers. Whipped toppings. Processed cheese and cheese spreads. Fats and oils Butter. Stick margarine. Lard. Shortening. Ghee. Bacon fat. Tropical oils, such as coconut, palm kernel, or palm oil. Seasoning and other foods Salted popcorn and pretzels. Onion salt, garlic salt, seasoned salt, table salt, and sea salt. Worcestershire sauce. Tartar sauce. Barbecue sauce. Teriyaki sauce. Soy sauce, including reduced-sodium. Steak sauce. Canned and packaged gravies. Fish sauce. Oyster sauce. Cocktail sauce. Horseradish that you find on the shelf. Ketchup. Mustard. Meat flavorings and tenderizers. Bouillon cubes. Hot sauce and Tabasco sauce. Premade or packaged marinades. Premade or packaged taco seasonings. Relishes. Regular salad dressings. Where to find more information:  National Heart, Lung, and [place]: https://wilson-eaton.com/  American Heart Association: www.heart.org Summary  The  DASH eating plan is a healthy eating plan that has been shown to reduce high blood pressure (hypertension). It may also reduce your risk for type 2 diabetes, heart disease, and stroke.  With the DASH eating plan, you should limit salt (sodium) intake to 2,300 mg a day. If you have hypertension, you may need to reduce your sodium intake to 1,500 mg a day.  When on the DASH eating plan, aim to eat more fresh fruits and vegetables, whole grains, lean proteins, low-fat  dairy, and heart-healthy fats.  Work with your health care provider or diet and nutrition specialist (dietitian) to adjust your eating plan to your individual calorie needs. This information is not intended to replace advice given to you by your health care provider. Make sure you discuss any questions you have with your health care provider. Document Released: 12/20/2010 Document Revised: 12/25/2015 Document Reviewed: 12/25/2015 Elsevier Interactive Patient Education  2019 [employer]

## 2018-07-10 NOTE — Progress Notes (Signed)
Subjective:  Patient ID: Mariah Dean, female    DOB: 08-Feb-1965  Age: 53 y.o. MRN: [id number]  CC: Follow-up (follow up on HTN,asthma,menopause consult/req colon order, has GYN for pap due in this year, MM due in 07/2018. )  HPI  HTN: Home BP readings: 127/80s- 160/90s. No headache, no dizziness, no edema, no blurry vision, no CP, no SOB.  She is also concerned about worsening hot flashes, onset after hysterectomy 2010. Also associates with irritability. She will like to try medication. OTC remedies have not been useful. She had FHx of ovarian cancer and CAD, hence does not want to take any hormonal replacement. She will also like referral for genetic counseling.  Reviewed past Medical, Social and Family history today.  Outpatient Medications Prior to Visit  Medication Sig Dispense Refill  . albuterol (PROVENTIL HFA;VENTOLIN HFA) 108 (90 Base) MCG/ACT inhaler Inhale 1-2 puffs into the lungs every 6 (six) hours as needed for wheezing or shortness of breath. 1 each 1  . cetirizine (ZYRTEC) 10 MG tablet Take 1 tablet (10 mg total) by mouth daily. 30 tablet 0  . fluticasone (FLONASE) 50 MCG/ACT nasal spray Place 2 sprays into both nostrils daily. 16 g 2  . Fluticasone-Salmeterol (ADVAIR) 500-50 MCG/DOSE AEPB Inhale 1 puff into the lungs 2 (two) times daily. 60 each 2  . guaiFENesin-dextromethorphan (ROBITUSSIN DM) 100-10 MG/5ML syrup Take 5 mLs by mouth every 4 (four) hours as needed for cough. 118 mL 0  . montelukast (SINGULAIR) 10 MG tablet Take 1 tablet (10 mg total) by mouth at bedtime. 30 tablet 3  . oxymetazoline (AFRIN NASAL SPRAY) 0.05 % nasal spray Place 1 spray into both nostrils 2 (two) times daily. Use only for 3days, then stop 30 mL 0  . Spacer/Aero-Holding Chambers (AEROCHAMBER PLUS FLO-VU LARGE) MISC 1 each by Other route 2 (two) times daily. 1 each 0   No facility-administered medications prior to visit.     ROS Review of Systems  Constitutional: Negative.    Respiratory: Negative.   Cardiovascular: Negative.   Neurological: Negative.     Objective:  BP 140/84   Pulse 71   Temp 98.6 F (37 C) (Oral)   Ht 5' 8.43" (1.738 m)   Wt 223 lb 12.8 oz (101.5 kg)   SpO2 95%   BMI 33.60 kg/m   BP Readings from Last 3 Encounters:  07/10/18 140/84  05/20/18 (!) 146/86  05/06/18 140/86    Wt Readings from Last 3 Encounters:  07/10/18 223 lb 12.8 oz (101.5 kg)  05/06/18 230 lb (104.3 kg)  03/20/18 221 lb 6.4 oz (100.4 kg)    Physical Exam Cardiovascular:     Rate and Rhythm: Normal rate.     Pulses: Normal pulses.  Pulmonary:     Effort: Pulmonary effort is normal.  Musculoskeletal:     Right lower leg: No edema.     Left lower leg: No edema.  Neurological:     Mental Status: She is alert and oriented to person, place, and time.  Psychiatric:        Mood and Affect: Mood normal.        Behavior: Behavior normal.        Thought Content: Thought content normal.     Assessment & Plan:   Annice PihJackie was seen today for follow-up.  Diagnoses and all orders for this visit:  Vasomotor symptoms due to menopause -     PARoxetine (PAXIL) 20 MG tablet; Take 1 tablet (20 mg total) by  mouth daily.  Elevated BP without diagnosis of hypertension  Family hx of ovarian malignancy -     Ambulatory referral to Genetics  Colon cancer screening -     Ambulatory referral to Gastroenterology   I am having Catalaya Dean start on PARoxetine. I am also having her maintain her albuterol, montelukast, Fluticasone-Salmeterol, AeroChamber Plus Flo-Vu Large, guaiFENesin-dextromethorphan, cetirizine, fluticasone, and oxymetazoline.  Meds ordered this encounter  Medications  . PARoxetine (PAXIL) 20 MG tablet    Sig: Take 1 tablet (20 mg total) by mouth daily.    Dispense:  30 tablet    Refill:  1    Order Specific Question:   Supervising Provider    Answer:   Lucille Passy [[id number]]    Problem List Items Addressed This Visit      Other    Elevated BP without diagnosis of hypertension   Family hx of ovarian malignancy   Relevant Orders   Ambulatory referral to Genetics    Other Visit Diagnoses    Vasomotor symptoms due to menopause    -  Primary   Relevant Medications   PARoxetine (PAXIL) 20 MG tablet   Colon cancer screening       Relevant Orders   Ambulatory referral to Gastroenterology       Follow-up: Return in about 4 weeks (around 08/07/2018) for CPE (fasting) and f/up menopausal symptoms.  Wilfred Lacy, NP

## 2018-07-13 ENCOUNTER — Encounter: Payer: Self-pay | Admitting: Genetic Counselor

## 2018-07-21 ENCOUNTER — Telehealth: Payer: Self-pay | Admitting: Nurse Practitioner

## 2018-07-21 NOTE — Telephone Encounter (Signed)
Pt called request paper work for face shield to be done again because she is unable to wear scarf or bandana at work. Paper work done, faxed, pt is aware. Pt also aware of risk with only wear face shield without mask/scarf/bandana.    Copied from [place] [phone]. Topic: General - Other >> Jul 20, 2018  4:31 PM Mcneil, Ja-Kwan wrote: Reason for CRM: Pt called for an update on the paperwork for the face shield. Pt requests call back. Cb# [phone] >> Jul 21, 2018  9:38 AM Yuli Lanigan, LPN wrote: LVM for the to call back.

## 2018-07-28 ENCOUNTER — Encounter: Payer: Self-pay | Admitting: Internal Medicine

## 2018-07-29 ENCOUNTER — Telehealth: Payer: Self-pay | Admitting: Genetic Counselor

## 2018-07-29 NOTE — Telephone Encounter (Signed)
Called pt per 7/14 sch message - unable to reach pt . Left message for patient to call back if reschedule still needed.

## 2018-08-07 ENCOUNTER — Encounter: Payer: BC Managed Care – PPO | Admitting: Nurse Practitioner

## 2018-08-17 ENCOUNTER — Encounter: Payer: BC Managed Care – PPO | Admitting: Genetic Counselor

## 2018-08-18 ENCOUNTER — Telehealth: Payer: Self-pay | Admitting: Genetic Counselor

## 2018-08-18 NOTE — Telephone Encounter (Signed)
Called patient regarding upcoming Webex appointment, left a voicemail. This will be a walk-in visit due to no communication to set up Webex.

## 2018-08-19 ENCOUNTER — Inpatient Hospital Stay: Payer: BC Managed Care – PPO | Attending: Genetic Counselor | Admitting: Genetic Counselor

## 2018-08-19 ENCOUNTER — Other Ambulatory Visit: Payer: Self-pay | Admitting: Genetic Counselor

## 2018-08-19 ENCOUNTER — Inpatient Hospital Stay: Payer: BC Managed Care – PPO

## 2018-08-19 ENCOUNTER — Encounter: Payer: Self-pay | Admitting: Genetic Counselor

## 2018-08-19 ENCOUNTER — Other Ambulatory Visit: Payer: Self-pay

## 2018-08-19 DIAGNOSIS — Z1379 Encounter for other screening for genetic and chromosomal anomalies: Secondary | ICD-10-CM

## 2018-08-19 DIAGNOSIS — Z800 Family history of malignant neoplasm of digestive organs: Secondary | ICD-10-CM

## 2018-08-19 DIAGNOSIS — Z803 Family history of malignant neoplasm of breast: Secondary | ICD-10-CM

## 2018-08-19 DIAGNOSIS — Z8041 Family history of malignant neoplasm of ovary: Secondary | ICD-10-CM

## 2018-08-19 NOTE — Progress Notes (Addendum)
REFERRING PROVIDER: Flossie Buffy, NP [place],  [place] [address]  PRIMARY PROVIDER:  Nche, Charlene Brooke, NP  PRIMARY REASON FOR VISIT:  1. Family history of ovarian cancer   2. Family history of colon cancer   3. Family history of breast cancer      HISTORY OF PRESENT ILLNESS:   Mariah Dean, a 53 y.o. female, was seen for a [place] cancer genetics consultation at the request of Dr. Lorayne Marek due to a family history of cancer.  Mariah Dean presents to clinic today to discuss the possibility of a hereditary predisposition to cancer, genetic testing, and to further clarify her future cancer risks, as well as potential cancer risks for family members.   Mariah Dean is a 53 y.o. female with no personal history of cancer.    CANCER HISTORY:  Oncology History   No history exists.     RISK FACTORS:  Menarche was at age 49.  First live birth at age 47.  OCP use for approximately <5 years years.  Ovaries intact: yes.  Hysterectomy: yes.  Menopausal status: postmenopausal.  HRT use: 0 years. Colonoscopy: no; not examined. Mammogram within the last year: yes. Number of breast biopsies: 1. Up to date with pelvic exams: yes. Any excessive radiation exposure in the past: no  Past Medical History:  Diagnosis Date  . Anemia   . Family history of breast cancer   . Family history of colon cancer   . Family history of ovarian cancer   . Hyperlipidemia   . Hypertension     Past Surgical History:  Procedure Laterality Date  . ABDOMINAL HYSTERECTOMY     2010, ovaries and cervix present  . BREAST SURGERY     Biopsy 1994    Social History   Socioeconomic History  . Marital status: Married    Spouse name: Not on file  . Number of children: Not on file  . Years of education: Not on file  . Highest education level: Not on file  Occupational History  . Not on file  Social Needs  . Financial resource strain: Not on file  . Food  insecurity    Worry: Not on file    Inability: Not on file  . Transportation needs    Medical: Not on file    Non-medical: Not on file  Tobacco Use  . Smoking status: Never Smoker  . Smokeless tobacco: Never Used  Substance and Sexual Activity  . Alcohol use: Never    Frequency: Never  . Drug use: Never  . Sexual activity: Not on file  Lifestyle  . Physical activity    Days per week: Not on file    Minutes per session: Not on file  . Stress: Not on file  Relationships  . Social Herbalist on phone: Not on file    Gets together: Not on file    Attends religious service: Not on file    Active member of club or organization: Not on file    Attends meetings of clubs or organizations: Not on file    Relationship status: Not on file  Other Topics Concern  . Not on file  Social History Narrative  . Not on file     FAMILY HISTORY:  We obtained a detailed, 4-generation family history.  Significant diagnoses are listed below: Family History  Problem Relation Age of Onset  . Cancer Mother 42       Ovarian   .  COPD Father   . Miscarriages / [place] Daughter   . Diabetes Maternal Grandmother   . Hypertension Maternal Grandmother   . Arthritis Maternal Grandmother   . Breast cancer Maternal Aunt        dx under 3  . Colon cancer Maternal Uncle        d. 65s  . Colon cancer Half-Brother        dx in his 14s  . Cancer Maternal Aunt        unknown cancer dx under 38  . Cancer Cousin        unknown cancer dx in his 17s  . Cancer Maternal Uncle        unknown cancer    The patient has one daughter who is cancer free. She has two sisters and two brothers who are cancer free. One sister died from 'internal bleeding'.  She has a paternal half brother who possibly had colon cancer in his 63's.  Her mother is deceased and her father is living.  The patient's mother died of probable ovarian cancer at 17.  She had four sisters and five brothers.  One sister had  probable breast cancer under 21, a second sister had an unknown cancer, as did this sisters son. The son died in his 10's.  A brother had colon cancer and died in his 43's and another brother had an unknown cancer.  The maternal grandparents are deceased from non cancer related issues.  The patient's father is cancer free.  He has five brothers and five sisters.  All are cancer free. The paternal grandparents are cancer free.   Mariah Dean is unaware of previous family history of genetic testing for hereditary cancer risks. Patient's maternal ancestors are of African American descent, and paternal ancestors are of African American descent. There is no reported Ashkenazi Jewish ancestry. There is no known consanguinity.    GENETIC COUNSELING ASSESSMENT: Mariah Dean is a 53 y.o. female with a family history of cancer which is somewhat suggestive of a hereditary cancer syndrome and predisposition to cancer given her mothers young age of ovarian cancer. We, therefore, discussed and recommended the following at today's visit.   DISCUSSION: We discussed that 5 - 10% of cancer is hereditary.  Different cancers have different risks for hereditary causes, for example ovarian cancer has up to a 20% risk for being hereditary, with most cases associated with BRCA mutations.  There are other genes that can be associated with hereditary ovarian cancer syndromes, as well as breast or colon cancer syndromes.  We discussed that testing is beneficial for several reasons including knowing how to follow individuals after completing their treatment, and understand if other family members could be at risk for cancer and allow them to undergo genetic testing.   We reviewed the characteristics, features and inheritance patterns of hereditary cancer syndromes. We also discussed genetic testing, including the appropriate family members to test, the process of testing, insurance coverage and turn-around-time for results.  We discussed the implications of a negative, positive and/or variant of uncertain significant result. We recommended Mariah Dean pursue genetic testing for the CancerNext+RNAinsight gene panel. The CancerNext gene panel offered by [employer] includes sequencing and rearrangement analysis for the following 34 genes:   APC, ATM, BARD1, BMPR1A, BRCA1, BRCA2, BRIP1, CDH1, CDK4, CDKN2A, CHEK2, DICER1, HOXB13, EPCAM, GREM1, MLH1, MRE11A, MSH2, MSH6, MUTYH, NBN, NF1, PALB2, PMS2, POLD1, POLE, PTEN, RAD50, RAD51C, RAD51D, SMAD4, SMARCA4, STK11, and TP53.   Based on Ms.  Dean's family history of cancer, she meets medical criteria for genetic testing. Despite that she meets criteria, she may still have an out of pocket cost. We discussed that if her out of pocket cost for testing is over $100, the laboratory will call and confirm whether she wants to proceed with testing.  If the out of pocket cost of testing is less than $100 she will be billed by the genetic testing laboratory.   PLAN: After considering the risks, benefits, and limitations, Mariah Dean provided informed consent to pursue genetic testing and the blood sample was sent to [facility] for analysis of the CancerNext+RNAinsight. Results should be available within approximately 2-3 weeks' time, at which point they will be disclosed by telephone to Mariah Dean, as will any additional recommendations warranted by these results. Mariah Dean will receive a summary of her genetic counseling visit and a copy of her results once available. This information will also be available in Epic.   Lastly, we encouraged Mariah Dean to remain in contact with cancer genetics annually so that we can continuously update the family history and inform her of any changes in cancer genetics and testing that may be of benefit for this family.   Mariah Dean questions were answered to her satisfaction today. Our contact  information was provided should additional questions or concerns arise. Thank you for the referral and allowing [place] to share in the care of your patient.   Brad Mcgaughy P. Florene Glen, [place], [facility] Licensed, Insurance risk surveyor [email or web address] phone: [phone]  The patient was seen for a total of 56 minutes in face-to-face genetic counseling.  This patient was discussed with Drs. Magrinat, Lindi Adie and/or Burr Medico who agrees with the above.    _______________________________________________________________________ For Office Staff:  Number of people involved in session: 1 Was an Intern/ student involved with case: no

## 2018-08-21 ENCOUNTER — Ambulatory Visit (AMBULATORY_SURGERY_CENTER): Payer: Self-pay | Admitting: *Deleted

## 2018-08-21 ENCOUNTER — Other Ambulatory Visit: Payer: Self-pay

## 2018-08-21 VITALS — Temp 96.9°F | Ht 69.0 in | Wt 225.6 lb

## 2018-08-21 DIAGNOSIS — Z1211 Encounter for screening for malignant neoplasm of colon: Secondary | ICD-10-CM

## 2018-08-21 DIAGNOSIS — Z800 Family history of malignant neoplasm of digestive organs: Secondary | ICD-10-CM

## 2018-08-21 NOTE — Progress Notes (Signed)
No egg or soy allergy known to patient  No issues with past sedation with any surgeries  or procedures, no intubation problems  No diet pills per patient No home 02 use per patient  No blood thinners per patient  Pt denies issues with constipation  No A fib or A flutter  EMMI video sent to pt's e mail   In person PV today temp 96.9 temporal

## 2018-08-26 ENCOUNTER — Other Ambulatory Visit (HOSPITAL_COMMUNITY)
Admission: RE | Admit: 2018-08-26 | Discharge: 2018-08-26 | Disposition: A | Discharge status: Home / Self Care | Payer: BC Managed Care – PPO | Source: Ambulatory Visit | Attending: Nurse Practitioner | Admitting: Nurse Practitioner

## 2018-08-26 ENCOUNTER — Ambulatory Visit (INDEPENDENT_AMBULATORY_CARE_PROVIDER_SITE_OTHER): Payer: BC Managed Care – PPO | Admitting: Nurse Practitioner

## 2018-08-26 ENCOUNTER — Encounter: Payer: Self-pay | Admitting: Nurse Practitioner

## 2018-08-26 ENCOUNTER — Other Ambulatory Visit: Payer: Self-pay

## 2018-08-26 VITALS — BP 150/82 | HR 70 | Temp 97.7°F | Ht 69.0 in | Wt 228.6 lb

## 2018-08-26 DIAGNOSIS — N951 Menopausal and female climacteric states: Secondary | ICD-10-CM

## 2018-08-26 DIAGNOSIS — Z13220 Encounter for screening for lipoid disorders: Secondary | ICD-10-CM

## 2018-08-26 DIAGNOSIS — Z0001 Encounter for general adult medical examination with abnormal findings: Secondary | ICD-10-CM | POA: Insufficient documentation

## 2018-08-26 DIAGNOSIS — I10 Essential (primary) hypertension: Secondary | ICD-10-CM | POA: Diagnosis not present

## 2018-08-26 DIAGNOSIS — Z124 Encounter for screening for malignant neoplasm of cervix: Secondary | ICD-10-CM

## 2018-08-26 DIAGNOSIS — Z136 Encounter for screening for cardiovascular disorders: Secondary | ICD-10-CM

## 2018-08-26 DIAGNOSIS — Z1231 Encounter for screening mammogram for malignant neoplasm of breast: Secondary | ICD-10-CM

## 2018-08-26 LAB — COMPREHENSIVE METABOLIC PANEL
ALT: 12 U/L (ref 0–35)
AST: 15 U/L (ref 0–37)
Albumin: 4.4 g/dL (ref 3.5–5.2)
Alkaline Phosphatase: 78 U/L (ref 39–117)
BUN: 13 mg/dL (ref 6–23)
CO2: 26 mEq/L (ref 19–32)
Calcium: 9.5 mg/dL (ref 8.4–10.5)
Chloride: 106 mEq/L (ref 96–112)
Creatinine, Ser: 0.76 mg/dL (ref 0.40–1.20)
GFR: 96.52 mL/min (ref >60.00)
Glucose, Bld: 81 mg/dL (ref 70–99)
Potassium: 3.6 mEq/L (ref 3.5–5.1)
Sodium: 140 mEq/L (ref 135–145)
Total Bilirubin: 0.3 mg/dL (ref 0.2–1.2)
Total Protein: 7.1 g/dL (ref 6.0–8.3)

## 2018-08-26 LAB — LIPID PANEL
Cholesterol: 202 mg/dL — ABNORMAL HIGH (ref 0–200)
HDL: 53.3 mg/dL (ref >39.00)
LDL Cholesterol: 134 mg/dL — ABNORMAL HIGH (ref 0–99)
NonHDL: 149.13
Total CHOL/HDL Ratio: 4
Triglycerides: 75 mg/dL (ref 0.0–149.0)
VLDL: 15 mg/dL (ref 0.0–40.0)

## 2018-08-26 LAB — CBC
HCT: 36.9 % (ref 36.0–46.0)
Hemoglobin: 11.7 g/dL — ABNORMAL LOW (ref 12.0–15.0)
MCHC: 31.7 g/dL (ref 30.0–36.0)
MCV: 77.8 fl — ABNORMAL LOW (ref 78.0–100.0)
Platelets: 222 10*3/uL (ref 150.0–400.0)
RBC: 4.75 Mil/uL (ref 3.87–5.11)
RDW: 15.7 % — ABNORMAL HIGH (ref 11.5–15.5)
WBC: 4.3 10*3/uL (ref 4.0–10.5)

## 2018-08-26 LAB — TSH: TSH: 1.85 u[IU]/mL (ref 0.35–4.50)

## 2018-08-26 MED ORDER — VENLAFAXINE HCL ER 37.5 MG PO CP24: 37.5000 mg | ORAL_CAPSULE | Freq: Every day | ORAL | 1 refills | Status: DC

## 2018-08-26 MED ORDER — HYDROCHLOROTHIAZIDE 12.5 MG PO CAPS: 12.5000 mg | ORAL_CAPSULE | Freq: Every day | ORAL | 5 refills | Status: DC

## 2018-08-26 NOTE — Progress Notes (Signed)
Subjective:    Patient ID: Mariah Dean, female    DOB: 1965-05-08, 53 y.o.   MRN: [id number]  Patient presents today for complete physical and eval of BP.  HPI  Persistent elevation of BP with LE edema, no dizziness or SOB or chest pain. BP Readings from Last 3 Encounters:  08/26/18 (!) 150/82  07/10/18 140/84  05/20/18 (!) 146/86   Vasomotor Symptoms: Unable to tolerate paxil: change in mood (loss of interest), took medication for 5days only. Will like to try effexor.  Sexual History (orientation,birth control, marital status, STD):married, sexually active, s/p hysterectomy (ovaries and cervix present)  Depression/Suicide: Depression screen [facility] 2/9 03/20/2018  Decreased Interest 0  Down, Depressed, Hopeless 0  PHQ - 2 Score 0   Vision:up to date  Dental:up to date  Immunizations: (TDAP, Hep C screen, Pneumovax, Influenza, zoster)  Health Maintenance  Topic Date Due  . Pap Smear  03/02/1987  . Colon Cancer Screening  03/01/2016  . Flu Shot  08/15/2018  . HIV Screening  08/26/2019*  . Mammogram  08/20/2019  . Tetanus Vaccine  06/14/2024  *Topic was postponed. The date shown is not the original due date.   Diet:regular.  Weight:  Wt Readings from Last 3 Encounters:  08/26/18 228 lb 9.6 oz (103.7 kg)  08/21/18 225 lb 9.6 oz (102.3 kg)  07/10/18 223 lb 12.8 oz (101.5 kg)   Exercise:none  Fall Risk: Fall Risk  03/20/2018  Falls in the past year? 0   Medications and allergies reviewed with patient and updated if appropriate.  Patient Active Problem List   Diagnosis Date Noted  . Family history of ovarian cancer   . Family history of colon cancer   . Family history of breast cancer   . Elevated BP without diagnosis of hypertension 05/20/2018  . Asthma 03/20/2018  . S/P partial hysterectomy 03/20/2018  . Family hx of ovarian malignancy 03/20/2018    Current Outpatient Medications on File Prior to Visit  Medication Sig Dispense Refill  . albuterol  (PROVENTIL HFA;VENTOLIN HFA) 108 (90 Base) MCG/ACT inhaler Inhale 1-2 puffs into the lungs every 6 (six) hours as needed for wheezing or shortness of breath. 1 each 1  . Fluticasone-Salmeterol (ADVAIR) 500-50 MCG/DOSE AEPB Inhale 1 puff into the lungs 2 (two) times daily. 60 each 2  . Polyethylene Glycol 3350 (MIRALAX PO) Take by mouth once. 238 grams for colon prep 8-21 with dulcolax 5 mg tabs x 4     No current facility-administered medications on file prior to visit.     Past Medical History:  Diagnosis Date  . Anemia   . Asthma   . Family history of breast cancer   . Family history of colon cancer   . Family history of ovarian cancer   . GERD (gastroesophageal reflux disease)    heart burn alot and uses OTC remedies   . Hyperlipidemia   . Hypertension     Past Surgical History:  Procedure Laterality Date  . ABDOMINAL HYSTERECTOMY     2010, ovaries and cervix present  . BREAST SURGERY     Biopsy 1994    Social History   Socioeconomic History  . Marital status: Married    Spouse name: Not on file  . Number of children: Not on file  . Years of education: Not on file  . Highest education level: Not on file  Occupational History  . Not on file  Social Needs  . Financial resource strain: Not on file  .  Food insecurity    Worry: Not on file    Inability: Not on file  . Transportation needs    Medical: Not on file    Non-medical: Not on file  Tobacco Use  . Smoking status: Never Smoker  . Smokeless tobacco: Never Used  Substance and Sexual Activity  . Alcohol use: Never    Frequency: Never  . Drug use: Never  . Sexual activity: Not on file  Lifestyle  . Physical activity    Days per week: Not on file    Minutes per session: Not on file  . Stress: Not on file  Relationships  . Social Herbalist on phone: Not on file    Gets together: Not on file    Attends religious service: Not on file    Active member of club or organization: Not on file     Attends meetings of clubs or organizations: Not on file    Relationship status: Not on file  Other Topics Concern  . Not on file  Social History Narrative  . Not on file    Family History  Problem Relation Age of Onset  . Cancer Mother 91       Ovarian   . Ovarian cancer Mother   . COPD Father   . Miscarriages / [place] Daughter   . Diabetes Maternal Grandmother   . Hypertension Maternal Grandmother   . Arthritis Maternal Grandmother   . Breast cancer Maternal Aunt        dx under 40  . Colon polyps Maternal Aunt   . Colon cancer Maternal Uncle        d. 73s  . Colon cancer Half-Brother        dx in his 67s  . Cancer Maternal Aunt        unknown cancer dx under 56  . Cancer Cousin        unknown cancer dx in his 43s  . Cancer Maternal Uncle        unknown cancer  . Esophageal cancer Neg Hx   . Rectal cancer Neg Hx   . Stomach cancer Neg Hx        Review of Systems  Constitutional: Negative for fever, malaise/fatigue and weight loss.  HENT: Negative for congestion and sore throat.   Eyes:       Negative for visual changes  Respiratory: Negative for cough and shortness of breath.   Cardiovascular: Negative for chest pain, palpitations and leg swelling.  Gastrointestinal: Negative for blood in stool, constipation, diarrhea and heartburn.  Genitourinary: Negative for dysuria, frequency and urgency.  Musculoskeletal: Negative for falls, joint pain and myalgias.  Skin: Negative for rash.  Neurological: Negative for dizziness, sensory change and headaches.  Endo/Heme/Allergies: Does not bruise/bleed easily.  Psychiatric/Behavioral: Negative for depression, substance abuse and suicidal ideas. The patient is not nervous/anxious.     Objective:   Vitals:   08/26/18 0857  BP: (!) 150/82  Pulse: 70  Temp: 97.7 F (36.5 C)  SpO2: 98%    Body mass index is 33.76 kg/m.   Physical Examination:  Physical Exam Vitals signs reviewed. Exam conducted with a  chaperone present.  Constitutional:      General: She is not in acute distress. HENT:     Right Ear: Tympanic membrane, ear canal and external ear normal.     Left Ear: Tympanic membrane, ear canal and external ear normal.     Mouth/Throat:  Pharynx: No oropharyngeal exudate.  Eyes:     General: No scleral icterus.    Extraocular Movements: Extraocular movements intact.     Conjunctiva/sclera: Conjunctivae normal.  Neck:     Musculoskeletal: Normal range of motion and neck supple.     Thyroid: No thyromegaly.  Cardiovascular:     Rate and Rhythm: Normal rate and regular rhythm.     Pulses: Normal pulses.     Heart sounds: Normal heart sounds.  Pulmonary:     Effort: Pulmonary effort is normal.     Breath sounds: Normal breath sounds.  Chest:     Breasts: Breasts are symmetrical.        Right: Normal. No mass, nipple discharge or skin change.        Left: Normal. No mass, nipple discharge or skin change.  Abdominal:     General: Bowel sounds are normal. There is no distension.     Palpations: Abdomen is soft.     Tenderness: There is no abdominal tenderness.     Hernia: There is no hernia in the left inguinal area or right inguinal area.  Genitourinary:    General: Normal vulva.     Labia:        Right: No rash or tenderness.        Left: No rash or tenderness.      Vagina: Normal. No vaginal discharge.     Cervix: Normal.     Uterus: Absent.      Adnexa: Right adnexa normal and left adnexa normal.     Rectum: Normal.  Musculoskeletal: Normal range of motion.        General: No tenderness.     Right lower leg: Edema present.     Left lower leg: Edema present.  Lymphadenopathy:     Cervical: No cervical adenopathy.     Upper Body:     Right upper body: No supraclavicular, axillary or pectoral adenopathy.     Left upper body: No supraclavicular, axillary or pectoral adenopathy.     Lower Body: No right inguinal adenopathy. No left inguinal adenopathy.  Skin:     General: Skin is warm and dry.  Neurological:     Mental Status: She is alert and oriented to person, place, and time.  Psychiatric:        Mood and Affect: Mood normal.        Behavior: Behavior normal.        Thought Content: Thought content normal.    ASSESSMENT and PLAN:  Karli was seen today for annual exam.  Diagnoses and all orders for this visit:  Encounter for preventative adult health care exam with abnormal findings -     Lipid panel -     CBC -     TSH -     Comp Met (CMET) -     Cytology - PAP( [place])  Encounter for Papanicolaou smear for cervical cancer screening -     Cytology - PAP( )  Encounter for lipid screening for cardiovascular disease -     Lipid panel  Essential hypertension -     hydrochlorothiazide (MICROZIDE) 12.5 MG capsule; Take 1 capsule (12.5 mg total) by mouth daily.  Vasomotor symptoms due to menopause -     venlafaxine XR (EFFEXOR-XR) 37.5 MG 24 hr capsule; Take 1 capsule (37.5 mg total) by mouth daily with breakfast.  Breast cancer screening by mammogram -     MM 3D SCREEN BREAST BILATERAL; Future  No problem-specific Assessment & Plan notes found for this encounter.      Problem List Items Addressed This Visit    None    Visit Diagnoses    Encounter for preventative adult health care exam with abnormal findings    -  Primary   Relevant Orders   Lipid panel   CBC   TSH   Comp Met (CMET)   Cytology - PAP( [place])   Encounter for Papanicolaou smear for cervical cancer screening       Relevant Orders   Cytology - PAP( [place])   Encounter for lipid screening for cardiovascular disease       Relevant Orders   Lipid panel   Essential hypertension       Relevant Medications   hydrochlorothiazide (MICROZIDE) 12.5 MG capsule   Vasomotor symptoms due to menopause       Relevant Medications   venlafaxine XR (EFFEXOR-XR) 37.5 MG 24 hr capsule   Breast cancer screening by mammogram       Relevant  Orders   MM 3D SCREEN BREAST BILATERAL       Follow up: Return in about 4 weeks (around 09/23/2018) for HTN (virtual appt).  Wilfred Lacy, NP

## 2018-08-26 NOTE — Patient Instructions (Addendum)
Maintain appt for colonoscopy.  Start HCTZ for HTN. Monitor BP at home 2x/week.  Go to lab for blood draw.  Hypertension, Adult High blood pressure (hypertension) is when the force of blood pumping through the arteries is too strong. The arteries are the blood vessels that carry blood from the heart throughout the body. Hypertension forces the heart to work harder to pump blood and may cause arteries to become narrow or stiff. Untreated or uncontrolled hypertension can cause a heart attack, heart failure, a stroke, kidney disease, and other problems. A blood pressure reading consists of a higher number over a lower number. Ideally, your blood pressure should be below 120/80. The first ("top") number is called the systolic pressure. It is a measure of the pressure in your arteries as your heart beats. The second ("bottom") number is called the diastolic pressure. It is a measure of the pressure in your arteries as the heart relaxes. What are the causes? The exact cause of this condition is not known. There are some conditions that result in or are related to high blood pressure. What increases the risk? Some risk factors for high blood pressure are under your control. The following factors may make you more likely to develop this condition:  Smoking.  Having type 2 diabetes mellitus, high cholesterol, or both.  Not getting enough exercise or physical activity.  Being overweight.  Having too much fat, sugar, calories, or salt (sodium) in your diet.  Drinking too much alcohol. Some risk factors for high blood pressure may be difficult or impossible to change. Some of these factors include:  Having chronic kidney disease.  Having a family history of high blood pressure.  Age. Risk increases with age.  Race. You may be at higher risk if you are African American.  Gender. Men are at higher risk than women before age 52. After age 52, women are at higher risk than men.  Having  obstructive sleep apnea.  Stress. What are the signs or symptoms? High blood pressure may not cause symptoms. Very high blood pressure (hypertensive crisis) may cause:  Headache.  Anxiety.  Shortness of breath.  Nosebleed.  Nausea and vomiting.  Vision changes.  Severe chest pain.  Seizures. How is this diagnosed? This condition is diagnosed by measuring your blood pressure while you are seated, with your arm resting on a flat surface, your legs uncrossed, and your feet flat on the floor. The cuff of the blood pressure monitor will be placed directly against the skin of your upper arm at the level of your heart. It should be measured at least twice using the same arm. Certain conditions can cause a difference in blood pressure between your right and left arms. Certain factors can cause blood pressure readings to be lower or higher than normal for a short period of time:  When your blood pressure is higher when you are in a health care provider's office than when you are at home, this is called white coat hypertension. Most people with this condition do not need medicines.  When your blood pressure is higher at home than when you are in a health care provider's office, this is called masked hypertension. Most people with this condition may need medicines to control blood pressure. If you have a high blood pressure reading during one visit or you have normal blood pressure with other risk factors, you may be asked to:  Return on a different day to have your blood pressure checked again.  Monitor  your blood pressure at home for 1 week or longer. If you are diagnosed with hypertension, you may have other blood or imaging tests to help your health care provider understand your overall risk for other conditions. How is this treated? This condition is treated by making healthy lifestyle changes, such as eating healthy foods, exercising more, and reducing your alcohol intake. Your health  care provider may prescribe medicine if lifestyle changes are not enough to get your blood pressure under control, and if:  Your systolic blood pressure is above 130.  Your diastolic blood pressure is above 80. Your personal target blood pressure may vary depending on your medical conditions, your age, and other factors. Follow these instructions at home: Eating and drinking   Eat a diet that is high in fiber and potassium, and low in sodium, added sugar, and fat. An example eating plan is called the DASH (Dietary Approaches to Stop Hypertension) diet. To eat this way: ? Eat plenty of fresh fruits and vegetables. Try to fill one half of your plate at each meal with fruits and vegetables. ? Eat whole grains, such as whole-wheat pasta, brown rice, or whole-grain bread. Fill about one fourth of your plate with whole grains. ? Eat or drink low-fat dairy products, such as skim milk or low-fat yogurt. ? Avoid fatty cuts of meat, processed or cured meats, and poultry with skin. Fill about one fourth of your plate with lean proteins, such as fish, chicken without skin, beans, eggs, or tofu. ? Avoid pre-made and processed foods. These tend to be higher in sodium, added sugar, and fat.  Reduce your daily sodium intake. Most people with hypertension should eat less than 1,500 mg of sodium a day.  Do not drink alcohol if: ? Your health care provider tells you not to drink. ? You are pregnant, may be pregnant, or are planning to become pregnant.  If you drink alcohol: ? Limit how much you use to:  0-1 drink a day for women.  0-2 drinks a day for men. ? Be aware of how much alcohol is in your drink. In the [place] one drink equals one 12 oz bottle of beer (355 mL), one 5 oz glass of wine (148 mL), or one 1 oz glass of hard liquor (44 mL). Lifestyle   Work with your health care provider to maintain a healthy body weight or to lose weight. Ask what an ideal weight is for you.  Get at least 30  minutes of exercise most days of the week. Activities may include walking, swimming, or biking.  Include exercise to strengthen your muscles (resistance exercise), such as Pilates or lifting weights, as part of your weekly exercise routine. Try to do these types of exercises for 30 minutes at least 3 days a week.  Do not use any products that contain nicotine or tobacco, such as cigarettes, e-cigarettes, and chewing tobacco. If you need help quitting, ask your health care provider.  Monitor your blood pressure at home as told by your health care provider.  Keep all follow-up visits as told by your health care provider. This is important. Medicines  Take over-the-counter and prescription medicines only as told by your health care provider. Follow directions carefully. Blood pressure medicines must be taken as prescribed.  Do not skip doses of blood pressure medicine. Doing this puts you at risk for problems and can make the medicine less effective.  Ask your health care provider about side effects or reactions to medicines that  you should watch for. Contact a health care provider if you:  Think you are having a reaction to a medicine you are taking.  Have headaches that keep coming back (recurring).  Feel dizzy.  Have swelling in your ankles.  Have trouble with your vision. Get help right away if you:  Develop a severe headache or confusion.  Have unusual weakness or numbness.  Feel faint.  Have severe pain in your chest or abdomen.  Vomit repeatedly.  Have trouble breathing. Summary  Hypertension is when the force of blood pumping through your arteries is too strong. If this condition is not controlled, it may put you at risk for serious complications.  Your personal target blood pressure may vary depending on your medical conditions, your age, and other factors. For most people, a normal blood pressure is less than 120/80.  Hypertension is treated with lifestyle  changes, medicines, or a combination of both. Lifestyle changes include losing weight, eating a healthy, low-sodium diet, exercising more, and limiting alcohol. This information is not intended to replace advice given to you by your health care provider. Make sure you discuss any questions you have with your health care provider. Document Released: 12/31/2004 Document Revised: 09/10/2017 Document Reviewed: 09/10/2017 Elsevier Patient Education  2020 [employer].

## 2018-08-27 ENCOUNTER — Encounter: Payer: Self-pay | Admitting: Internal Medicine

## 2018-08-27 LAB — CYTOLOGY - PAP
Diagnosis: NEGATIVE
HPV: NOT DETECTED

## 2018-09-03 ENCOUNTER — Telehealth: Payer: Self-pay

## 2018-09-03 NOTE — Telephone Encounter (Signed)
Covid-19 screening questions   Do you now or have you had a fever in the last 14 days? NO  Do you have any respiratory symptoms of shortness of breath or cough now or in the last 14 days? NO  Do you have any family members or close contacts with diagnosed or suspected Covid-19 in the past 14 days? NO  Have you been tested for Covid-19 and found to be positive? NO     Confirmed with patient   

## 2018-09-04 ENCOUNTER — Encounter: Payer: Self-pay | Admitting: Internal Medicine

## 2018-09-04 ENCOUNTER — Ambulatory Visit (AMBULATORY_SURGERY_CENTER): Payer: BC Managed Care – PPO | Admitting: Internal Medicine

## 2018-09-04 ENCOUNTER — Other Ambulatory Visit: Payer: Self-pay

## 2018-09-04 VITALS — BP 118/85 | HR 67 | Temp 97.8°F | Resp 16 | Ht 69.0 in | Wt 225.0 lb

## 2018-09-04 DIAGNOSIS — Z800 Family history of malignant neoplasm of digestive organs: Secondary | ICD-10-CM | POA: Diagnosis not present

## 2018-09-04 DIAGNOSIS — Z1211 Encounter for screening for malignant neoplasm of colon: Secondary | ICD-10-CM

## 2018-09-04 MED ORDER — SODIUM CHLORIDE 0.9 % IV SOLN: 500.0000 mL | Freq: Once | INTRAVENOUS | Status: DC

## 2018-09-04 NOTE — Progress Notes (Signed)
Pt's states no medical or surgical changes since previsit or office visit.  JB temps and CW vitals. SM

## 2018-09-04 NOTE — Patient Instructions (Addendum)
Your colonoscopy was normal.  I am going to look at your chart again in 1 year to see if it makes sense to repeat a colonoscopy then - it might depending upon what we see from genetics evaluation.   I appreciate the opportunity to care for you. Gatha Mayer, MD, FACG  YOU HAD AN ENDOSCOPIC PROCEDURE TODAY AT [facility]:   Refer to the procedure report that was given to you for any specific questions about what was found during the examination.  If the procedure report does not answer your questions, please call your gastroenterologist to clarify.  If you requested that your care partner not be given the details of your procedure findings, then the procedure report has been included in a sealed envelope for you to review at your convenience later.  YOU SHOULD EXPECT: Some feelings of bloating in the abdomen. Passage of more gas than usual.  Walking can help get rid of the air that was put into your GI tract during the procedure and reduce the bloating. If you had a lower endoscopy (such as a colonoscopy or flexible sigmoidoscopy) you may notice spotting of blood in your stool or on the toilet paper. If you underwent a bowel prep for your procedure, you may not have a normal bowel movement for a few days.  Please Note:  You might notice some irritation and congestion in your nose or some drainage.  This is from the oxygen used during your procedure.  There is no need for concern and it should clear up in a day or so.  SYMPTOMS TO REPORT IMMEDIATELY:   Following lower endoscopy (colonoscopy or flexible sigmoidoscopy):  Excessive amounts of blood in the stool  Significant tenderness or worsening of abdominal pains  Swelling of the abdomen that is new, acute  Fever of 100F or higher  For urgent or emergent issues, a gastroenterologist can be reached at any hour by calling [phone].  DIET:  We do recommend a small meal at first, but then you may proceed to your  regular diet.  Drink plenty of fluids but you should avoid alcoholic beverages for 24 hours.  ACTIVITY:  You should plan to take it easy for the rest of today and you should NOT DRIVE or use heavy machinery until tomorrow (because of the sedation medicines used during the test).    FOLLOW UP: Our staff will call the number listed on your records 48-72 hours following your procedure to check on you and address any questions or concerns that you may have regarding the information given to you following your procedure. If we do not reach you, we will leave a message.  We will attempt to reach you two times.  During this call, we will ask if you have developed any symptoms of COVID 19. If you develop any symptoms (ie: fever, flu-like symptoms, shortness of breath, cough etc.) before then, please call [phone].  If you test positive for Covid 19 in the 2 weeks post procedure, please call and report this information to [place].    If any biopsies were taken you will be contacted by phone or by letter within the next 1-3 weeks.  Please call us at [phone] if you have not heard about the biopsies in 3 weeks.    SIGNATURES/CONFIDENTIALITY: You and/or your care partner have signed paperwork which will be entered into your electronic medical record.  These signatures attest to the fact that that the information above  on your After Visit Summary has been reviewed and is understood.  Full responsibility of the confidentiality of this discharge information lies with you and/or your care-partner.  Normal colonoscopy!  Continue your normal medications

## 2018-09-04 NOTE — Op Note (Signed)
[facility] Patient Name: Mariah MaywoodJackie Dean Procedure Date: 09/04/2018 10:37 AM MRN: [id number] Endoscopist: Iva Booparl E Gessner , MD Age: [age] Referring MD:  Date of Birth: [birth year] Gender: Female Account #: [id number] Procedure:                Colonoscopy Indications:              Screening in patient at increased risk: Colorectal                            cancer in brother before age 52 Half brother in                            20's, uncle in 8850's, other cancers including                            ovarioan Medicines:                Propofol per Anesthesia, Monitored Anesthesia Care Procedure:                Pre-Anesthesia Assessment:                           - Prior to the procedure, a History and Physical                            was performed, and patient medications and                            allergies were reviewed. The patient's tolerance of                            previous anesthesia was also reviewed. The risks                            and benefits of the procedure and the sedation                            options and risks were discussed with the patient.                            All questions were answered, and informed consent                            was obtained. Prior Anticoagulants: The patient has                            taken no previous anticoagulant or antiplatelet                            agents. ASA Grade Assessment: II - A patient with                            mild systemic disease. After reviewing the risks  and benefits, the patient was deemed in                            satisfactory condition to undergo the procedure.                           After obtaining informed consent, the colonoscope                            was passed under direct vision. Throughout the                            procedure, the patient's blood pressure, pulse, and                            oxygen saturations were  monitored continuously. The                            Colonoscope was introduced through the anus and                            advanced to the the cecum, identified by                            appendiceal orifice and ileocecal valve. The                            colonoscopy was performed without difficulty. The                            patient tolerated the procedure well. The quality                            of the bowel preparation was excellent. The                            ileocecal valve, appendiceal orifice, and rectum                            were photographed. The bowel preparation used was                            Miralax via split dose instruction. Scope In: 10:42:03 AM Scope Out: 10:51:17 AM Scope Withdrawal Time: 0 hours 6 minutes 30 seconds  Total Procedure Duration: 0 hours 9 minutes 14 seconds  Findings:                 The perianal exam findings include a skin tag.                           The digital rectal exam was normal.                           The entire examined colon appeared normal on direct  and retroflexion views. Complications:            No immediate complications. Estimated Blood Loss:     Estimated blood loss: none. Impression:               - Perianal skin tag found on perianal exam.                           - The entire examined colon is normal on direct and                            retroflexion views.                           - No specimens collected. Recommendation:           - Patient has a contact number available for                            emergencies. The signs and symptoms of potential                            delayed complications were discussed with the                            patient. Return to normal activities tomorrow.                            Written discharge instructions were provided to the                            patient.                           - Resume previous diet.                            - Continue present medications.                           - Repeat colonoscopy in 1 year for screening                            purposes. Will reassess by then - she is awaiting                            genetics evaluation results re: FHx colon, ovarian                            and other cancers Iva Booparl E Gessner, MD 09/04/2018 11:01:52 AM This report has been signed electronically.

## 2018-09-04 NOTE — Progress Notes (Signed)
To PACU, VSS. Report to [email or web address] 

## 2018-09-08 ENCOUNTER — Telehealth: Payer: Self-pay | Admitting: *Deleted

## 2018-09-08 ENCOUNTER — Encounter: Payer: Self-pay | Admitting: Genetic Counselor

## 2018-09-08 ENCOUNTER — Telehealth: Payer: Self-pay | Admitting: Genetic Counselor

## 2018-09-08 DIAGNOSIS — Z1379 Encounter for other screening for genetic and chromosomal anomalies: Secondary | ICD-10-CM | POA: Insufficient documentation

## 2018-09-08 NOTE — Telephone Encounter (Signed)
  Follow up Call-  Call back number 09/04/2018  Post procedure Call Back phone  # [phone]  Permission to leave phone message Yes     Patient questions:  Message left to call us if necessary.

## 2018-09-08 NOTE — Telephone Encounter (Signed)
LM on VM that results were back and to please call.  Left CB instructions. 

## 2018-09-08 NOTE — Telephone Encounter (Signed)
  Follow up Call-  Call back number 09/04/2018  Post procedure Call Back phone  # [phone]  Permission to leave phone message Yes     Patient questions:  Message left to call us if necessary.  Second call.

## 2018-09-10 ENCOUNTER — Telehealth: Payer: Self-pay | Admitting: Genetic Counselor

## 2018-09-10 NOTE — Telephone Encounter (Signed)
LM on VM that results are back and to please call for results.

## 2018-09-15 ENCOUNTER — Ambulatory Visit: Payer: Self-pay | Admitting: Genetic Counselor

## 2018-09-15 DIAGNOSIS — Z1379 Encounter for other screening for genetic and chromosomal anomalies: Secondary | ICD-10-CM

## 2018-09-15 NOTE — Telephone Encounter (Signed)
Revealed negative genetic testing.  Discussed that we do not know why there is cancer in the family. It could be due to a different gene that we are not testing, or maybe our current technology may not be able to pick something up.  It will be important for her to keep in contact with genetics to keep up with whether additional testing may be needed.  Two VUS identified.  No medical management is recommended based on these.

## 2018-09-15 NOTE — Progress Notes (Signed)
HPI:  [name] was previously seen in the [place] clinic due to a family history of cancer and concerns regarding a hereditary predisposition to cancer. Please refer to our prior cancer genetics clinic note for more information regarding our discussion, assessment and recommendations, at the time. [name] recent genetic test results were disclosed to her, as were recommendations warranted by these results. These results and recommendations are discussed in more detail below.  CANCER HISTORY:  Oncology History   No history exists.    FAMILY HISTORY:  We obtained a detailed, 4-generation family history.  Significant diagnoses are listed below: Family History  Problem Relation Age of Onset   Cancer Mother 72       Ovarian    Ovarian cancer Mother    COPD Father    51 / Stillbirths Daughter    Diabetes Maternal Grandmother    Hypertension Maternal Grandmother    Arthritis Maternal Grandmother    Breast cancer Maternal Aunt        dx under 72   Colon polyps Maternal Aunt    Colon cancer Maternal Uncle        d. 6s   Colon cancer Half-Brother        dx in his 94s   Cancer Maternal Aunt        unknown cancer dx under 72   Cancer Cousin        unknown cancer dx in his 58s   Cancer Maternal Uncle        unknown cancer   Esophageal cancer Neg Hx    Rectal cancer Neg Hx    Stomach cancer Neg Hx     The patient has one daughter who is cancer free. She has two sisters and two brothers who are cancer free. One sister died from 'internal bleeding'.  She has a paternal half brother who possibly had colon cancer in his 64's.  Her mother is deceased and her father is living.  The patient's mother died of probable ovarian cancer at 15.  She had four sisters and five brothers.  One sister had probable breast cancer under 92, a second sister had an unknown cancer, as did this sisters son. The son died in his 65's.  A brother had  colon cancer and died in his 26's and another brother had an unknown cancer.  The maternal grandparents are deceased from non cancer related issues.  The patient's father is cancer free.  He has five brothers and five sisters.  All are cancer free. The paternal grandparents are cancer free.   [name] is unaware of previous family history of genetic testing for hereditary cancer risks. Patient's maternal ancestors are of African American descent, and paternal ancestors are of African American descent. There is no reported Ashkenazi Jewish ancestry. There is no known consanguinity.     GENETIC TEST RESULTS: Genetic testing reported out on September 07, 2018 through the [facility] cancer panel found no pathogenic mutations. The [facility]Next-Cancer gene panel offered by [facility] and includes sequencing and rearrangement analysis for the following 91 genes: AIP, ALK, APC*, ATM*, AXIN2, BAP1, BARD1, BLM, BMPR1A, BRCA1*, BRCA2*, BRIP1*, CDC73, CDH1*, CDK4, CDKN1B, CDKN2A, CHEK2*, CTNNA1, DICER1, FANCC, FH, FLCN, GALNT12, KIF1B, LZTR1, MAX, MEN1, MET, MLH1*, MRE11A, MSH2*, MSH3, MSH6*, MUTYH*, NBN, NF1*, NF2, NTHL1, PALB2*, PHOX2B, PMS2*, POT1, PRKAR1A, PTCH1, PTEN*, RAD50, RAD51C*, RAD51D*, RB1, RECQL, RET, SDHA, SDHAF2, SDHB, SDHC, SDHD, SMAD4, SMARCA4, SMARCB1, SMARCE1, STK11, SUFU, TMEM127, TP53*, TSC1, TSC2, VHL and  XRCC2 (sequencing and deletion/duplication); CASR, CFTR, CPA1, CTRC, EGFR, EGLN1, FAM175A, HOXB13, KIT, MITF, MLH3, PALLD, PDGFRA, POLD1, POLE, PRSS1, RINT1, RPS20, SPINK1 and TERT (sequencing only); EPCAM and GREM1 (deletion/duplication only). DNA and RNA analyses performed for * genes. The test report has been scanned into EPIC and is located under the Molecular Pathology section of the Results Review tab.  A portion of the result report is included below for reference.     We discussed with [name] that because current genetic testing is not perfect,  it is possible there may be a gene mutation in one of these genes that current testing cannot detect, but that chance is small.  We also discussed, that there could be another gene that has not yet been discovered, or that we have not yet tested, that is responsible for the cancer diagnoses in the family. It is also possible there is a hereditary cause for the cancer in the family that [name] did not inherit and therefore was not identified in her testing.  Therefore, it is important to remain in touch with cancer genetics in the future so that we can continue to offer [name] the most up to date genetic testing.   Genetic testing did identify two Variants of uncertain significance (VUS) - one in the DICER1 gene called p.[id number], and a second in the MSH6 gene called p.K854M.  At this time, it is unknown if these variants are associated with increased cancer risk or if they are normal findings, but most variants such as these get reclassified to being inconsequential. They should not be used to make medical management decisions. With time, we suspect the lab will determine the significance of these variants, if any. If we do learn more about them, we will try to contact [name] to discuss it further. However, it is important to stay in touch with [place] periodically and keep the address and phone number up to date.  ADDITIONAL GENETIC TESTING: We discussed with [name] that her genetic testing was fairly extensive.  If there are genes identified to increase cancer risk that can be analyzed in the future, we would be happy to discuss and coordinate this testing at that time.    CANCER SCREENING RECOMMENDATIONS: [name]'s test result is considered negative (normal).  This means that we have not identified a hereditary cause for her personal and family history of cancer at this time. Most cancers happen by chance and this negative test suggests that her cancer may fall into  this category.    While reassuring, this does not definitively rule out a hereditary predisposition to cancer. It is still possible that there could be genetic mutations that are undetectable by current technology. There could be genetic mutations in genes that have not been tested or identified to increase cancer risk.  Therefore, it is recommended she continue to follow the cancer management and screening guidelines provided by her primary healthcare provider.   An individual's cancer risk and medical management are not determined by genetic test results alone. Overall cancer risk assessment incorporates additional factors, including personal medical history, family history, and any available genetic information that may result in a personalized plan for cancer prevention and surveillance  RECOMMENDATIONS FOR FAMILY MEMBERS:  Individuals in this family might be at some increased risk of developing cancer, over the general population risk, simply due to the family history of cancer.  We recommended women in this family have a yearly mammogram beginning at age 36,  or 10 years younger than the earliest onset of cancer, an annual clinical breast exam, and perform monthly breast self-exams. Women in this family should also have a gynecological exam as recommended by their primary provider. All family members should have a colonoscopy by age 83.  It is also possible there is a hereditary cause for the cancer in [name]'s family that she did not inherit and therefore was not identified in her.  Based on [name]'s family history, we recommended her brother, who was diagnosed with colon cancer in his 20's, have genetic counseling and testing. [name] will let us know if we can be of any assistance in coordinating genetic counseling and/or testing for this family member.   FOLLOW-UP: Lastly, we discussed with [name] that cancer genetics is a rapidly advancing field and it is  possible that new genetic tests will be appropriate for her and/or her family members in the future. We encouraged her to remain in contact with cancer genetics on an annual basis so we can update her personal and family histories and let her know of advances in cancer genetics that may benefit this family.   Our contact number was provided. [name]'s questions were answered to her satisfaction, and she knows she is welcome to call us at anytime with additional questions or concerns.   Roma Kayser, [place], [facility] Licensed, Certified Genetic Counselor [email or web address]

## 2018-09-17 ENCOUNTER — Encounter: Payer: Self-pay | Admitting: Nurse Practitioner

## 2018-09-25 ENCOUNTER — Telehealth (INDEPENDENT_AMBULATORY_CARE_PROVIDER_SITE_OTHER): Payer: BC Managed Care – PPO | Admitting: Nurse Practitioner

## 2018-09-25 ENCOUNTER — Encounter: Payer: Self-pay | Admitting: Nurse Practitioner

## 2018-09-25 ENCOUNTER — Other Ambulatory Visit: Payer: Self-pay

## 2018-09-25 DIAGNOSIS — I10 Essential (primary) hypertension: Secondary | ICD-10-CM

## 2018-09-25 MED ORDER — HYDROCHLOROTHIAZIDE 12.5 MG PO CAPS: 12.5000 mg | ORAL_CAPSULE | Freq: Every day | ORAL | 1 refills | Status: DC

## 2018-09-25 NOTE — Patient Instructions (Signed)
Continue current medication Check BP in morning. Call office if BP>140/80. Maintain DASH diet and adequate oral hydration.   DASH Eating Plan DASH stands for "Dietary Approaches to Stop Hypertension." The DASH eating plan is a healthy eating plan that has been shown to reduce high blood pressure (hypertension). It may also reduce your risk for type 2 diabetes, heart disease, and stroke. The DASH eating plan may also help with weight loss. What are tips for following this plan?  General guidelines  Avoid eating more than 2,300 mg (milligrams) of salt (sodium) a day. If you have hypertension, you may need to reduce your sodium intake to 1,500 mg a day.  Limit alcohol intake to no more than 1 drink a day for nonpregnant women and 2 drinks a day for men. One drink equals 12 oz of beer, 5 oz of wine, or 1 oz of hard liquor.  Work with your health care provider to maintain a healthy body weight or to lose weight. Ask what an ideal weight is for you.  Get at least 30 minutes of exercise that causes your heart to beat faster (aerobic exercise) most days of the week. Activities may include walking, swimming, or biking.  Work with your health care provider or diet and nutrition specialist (dietitian) to adjust your eating plan to your individual calorie needs. Reading food labels   Check food labels for the amount of sodium per serving. Choose foods with less than 5 percent of the Daily Value of sodium. Generally, foods with less than 300 mg of sodium per serving fit into this eating plan.  To find whole grains, look for the word "whole" as the first word in the ingredient list. Shopping  Buy products labeled as "low-sodium" or "no salt added."  Buy fresh foods. Avoid canned foods and premade or frozen meals. Cooking  Avoid adding salt when cooking. Use salt-free seasonings or herbs instead of table salt or sea salt. Check with your health care provider or pharmacist before using salt  substitutes.  Do not fry foods. Cook foods using healthy methods such as baking, boiling, grilling, and broiling instead.  Cook with heart-healthy oils, such as olive, canola, soybean, or sunflower oil. Meal planning  Eat a balanced diet that includes: ? 5 or more servings of fruits and vegetables each day. At each meal, try to fill half of your plate with fruits and vegetables. ? Up to 6-8 servings of whole grains each day. ? Less than 6 oz of lean meat, poultry, or fish each day. A 3-oz serving of meat is about the same size as a deck of cards. One egg equals 1 oz. ? 2 servings of low-fat dairy each day. ? A serving of nuts, seeds, or beans 5 times each week. ? Heart-healthy fats. Healthy fats called Omega-3 fatty acids are found in foods such as flaxseeds and coldwater fish, like sardines, salmon, and mackerel.  Limit how much you eat of the following: ? Canned or prepackaged foods. ? Food that is high in trans fat, such as fried foods. ? Food that is high in saturated fat, such as fatty meat. ? Sweets, desserts, sugary drinks, and other foods with added sugar. ? Full-fat dairy products.  Do not salt foods before eating.  Try to eat at least 2 vegetarian meals each week.  Eat more home-cooked food and less restaurant, buffet, and fast food.  When eating at a restaurant, ask that your food be prepared with less salt or no salt,  if possible. What foods are recommended? The items listed may not be a complete list. Talk with your dietitian about what dietary choices are best for you. Grains Whole-grain or whole-wheat bread. Whole-grain or whole-wheat pasta. Brown rice. [place]. Bulgur. Whole-grain and low-sodium cereals. Pita bread. Low-fat, low-sodium crackers. Whole-wheat flour tortillas. Vegetables Fresh or frozen vegetables (raw, steamed, roasted, or grilled). Low-sodium or reduced-sodium tomato and vegetable juice. Low-sodium or reduced-sodium tomato sauce and tomato  paste. Low-sodium or reduced-sodium canned vegetables. Fruits All fresh, dried, or frozen fruit. Canned fruit in natural juice (without added sugar). Meat and other protein foods Skinless chicken or [place]. Ground chicken or [place]. Pork with fat trimmed off. Fish and seafood. Egg whites. Dried beans, peas, or lentils. Unsalted nuts, nut butters, and seeds. Unsalted canned beans. Lean cuts of beef with fat trimmed off. Low-sodium, lean deli meat. Dairy Low-fat (1%) or fat-free (skim) milk. Fat-free, low-fat, or reduced-fat cheeses. Nonfat, low-sodium ricotta or cottage cheese. Low-fat or nonfat yogurt. Low-fat, low-sodium cheese. Fats and oils Soft margarine without trans fats. Vegetable oil. Low-fat, reduced-fat, or light mayonnaise and salad dressings (reduced-sodium). Canola, safflower, olive, soybean, and sunflower oils. Avocado. Seasoning and other foods Herbs. Spices. Seasoning mixes without salt. Unsalted popcorn and pretzels. Fat-free sweets. What foods are not recommended? The items listed may not be a complete list. Talk with your dietitian about what dietary choices are best for you. Grains Baked goods made with fat, such as croissants, muffins, or some breads. Dry pasta or rice meal packs. Vegetables Creamed or fried vegetables. Vegetables in a cheese sauce. Regular canned vegetables (not low-sodium or reduced-sodium). Regular canned tomato sauce and paste (not low-sodium or reduced-sodium). Regular tomato and vegetable juice (not low-sodium or reduced-sodium). Angie Fava. Olives. Fruits Canned fruit in a light or heavy syrup. Fried fruit. Fruit in cream or butter sauce. Meat and other protein foods Fatty cuts of meat. Ribs. Fried meat. Berniece Salines. Sausage. Bologna and other processed lunch meats. Salami. Fatback. Hotdogs. Bratwurst. Salted nuts and seeds. Canned beans with added salt. Canned or smoked fish. Whole eggs or egg yolks. Chicken or [place] with skin. Dairy Whole or 2% milk,  cream, and half-and-half. Whole or full-fat cream cheese. Whole-fat or sweetened yogurt. Full-fat cheese. Nondairy creamers. Whipped toppings. Processed cheese and cheese spreads. Fats and oils Butter. Stick margarine. Lard. Shortening. Ghee. Bacon fat. Tropical oils, such as coconut, palm kernel, or palm oil. Seasoning and other foods Salted popcorn and pretzels. Onion salt, garlic salt, seasoned salt, table salt, and sea salt. Worcestershire sauce. Tartar sauce. Barbecue sauce. Teriyaki sauce. Soy sauce, including reduced-sodium. Steak sauce. Canned and packaged gravies. Fish sauce. Oyster sauce. Cocktail sauce. Horseradish that you find on the shelf. Ketchup. Mustard. Meat flavorings and tenderizers. Bouillon cubes. Hot sauce and Tabasco sauce. Premade or packaged marinades. Premade or packaged taco seasonings. Relishes. Regular salad dressings. Where to find more information:  National Heart, Lung, and [place]: https://wilson-eaton.com/  American Heart Association: www.heart.org Summary  The DASH eating plan is a healthy eating plan that has been shown to reduce high blood pressure (hypertension). It may also reduce your risk for type 2 diabetes, heart disease, and stroke.  With the DASH eating plan, you should limit salt (sodium) intake to 2,300 mg a day. If you have hypertension, you may need to reduce your sodium intake to 1,500 mg a day.  When on the DASH eating plan, aim to eat more fresh fruits and vegetables, whole grains, lean proteins, low-fat dairy, and heart-healthy fats.  Work with your health care provider or diet and nutrition specialist (dietitian) to adjust your eating plan to your individual calorie needs. This information is not intended to replace advice given to you by your health care provider. Make sure you discuss any questions you have with your health care provider. Document Released: 12/20/2010 Document Revised: 12/13/2016 Document Reviewed: 12/25/2015 Elsevier  Patient Education  2020 [employer].

## 2018-09-25 NOTE — Progress Notes (Signed)
Virtual Visit via Video Note  I connected with Mariah Dean on 09/25/18 at  8:15 AM EDT by a video enabled telemedicine application and verified that I am speaking with the correct person using two identifiers.  Location: Patient: home Provider: offic   I discussed the limitations of evaluation and management by telemedicine and the availability of in person appointments. The patient expressed understanding and agreed to proceed.  CC: HTN f/up  History of Present Illness: HTN: Improved with HCTZ 12.5mg  Denies any adverse side effects BP Readings from Last 3 Encounters:  09/25/18 129/73  09/04/18 118/85  08/26/18 (!) 150/82     Observations/Objective: Physical Exam  Constitutional: She is oriented to person, place, and time. She appears well-developed.  Pulmonary/Chest: Effort normal.  Musculoskeletal:        General: No edema.  Neurological: She is alert and oriented to person, place, and time.  Vitals reviewed.  Assessment and Plan: Mariah Dean was seen today for follow-up.  Diagnoses and all orders for this visit:  Essential hypertension -     hydrochlorothiazide (MICROZIDE) 12.5 MG capsule; Take 1 capsule (12.5 mg total) by mouth daily.   Follow Up Instructions: Se avs   I discussed the assessment and treatment plan with the patient. The patient was provided an opportunity to ask questions and all were answered. The patient agreed with the plan and demonstrated an understanding of the instructions.   The patient was advised to call back or seek an in-person evaluation if the symptoms worsen or if the condition fails to improve as anticipated.  Daphane Shepherd, NP

## 2018-10-09 ENCOUNTER — Ambulatory Visit: Payer: BC Managed Care – PPO

## 2018-11-11 ENCOUNTER — Encounter: Payer: Self-pay | Admitting: Genetic Counselor

## 2018-11-19 ENCOUNTER — Other Ambulatory Visit: Payer: Self-pay

## 2018-11-19 ENCOUNTER — Ambulatory Visit
Admission: RE | Admit: 2018-11-19 | Discharge: 2018-11-19 | Disposition: A | Discharge status: Home / Self Care | Payer: BC Managed Care – PPO | Source: Ambulatory Visit | Attending: Nurse Practitioner | Admitting: Nurse Practitioner

## 2018-11-19 DIAGNOSIS — Z1231 Encounter for screening mammogram for malignant neoplasm of breast: Secondary | ICD-10-CM

## 2018-12-25 ENCOUNTER — Encounter: Payer: Self-pay | Admitting: Nurse Practitioner

## 2018-12-25 ENCOUNTER — Other Ambulatory Visit: Payer: Self-pay

## 2018-12-25 ENCOUNTER — Telehealth (INDEPENDENT_AMBULATORY_CARE_PROVIDER_SITE_OTHER): Payer: BC Managed Care – PPO | Admitting: Nurse Practitioner

## 2018-12-25 VITALS — BP 126/69 | HR 81 | Ht 69.0 in | Wt 227.0 lb

## 2018-12-25 DIAGNOSIS — Z6833 Body mass index (BMI) 33.0-33.9, adult: Secondary | ICD-10-CM | POA: Diagnosis not present

## 2018-12-25 DIAGNOSIS — N951 Menopausal and female climacteric states: Secondary | ICD-10-CM

## 2018-12-25 DIAGNOSIS — I10 Essential (primary) hypertension: Secondary | ICD-10-CM | POA: Diagnosis not present

## 2018-12-25 DIAGNOSIS — E6609 Other obesity due to excess calories: Secondary | ICD-10-CM | POA: Insufficient documentation

## 2018-12-25 MED ORDER — HYDROCHLOROTHIAZIDE 12.5 MG PO CAPS: 12.5000 mg | ORAL_CAPSULE | Freq: Every day | ORAL | 2 refills | Status: DC

## 2018-12-25 NOTE — Progress Notes (Signed)
Interactive audio and video telecommunications were attempted between this provider and patient, however failed, due to patient having technical difficulties OR patient did not have access to video capability.  We continued and completed visit with audio only.   Virtual Visit via Video Note  I connected with Mariah Dean on 12/26/18 at  9:00 AM EST by a video enabled telemedicine application and verified that I am speaking with the correct person using two identifiers.  Location: Patient:home Provider: office Participants: patient and provider I discussed the limitations of evaluation and management by telemedicine and the availability of in person appointments. The patient expressed understanding and agreed to proceed.  CC:3 mo f/u on BP--12/10--125/66, 12/9--123/70, 12/8--130/81, 12/7--128/88, 12/6--117/71.  History of Present Illness: Controlled with HCTZ. Denies any adverse side effects. BP Readings from Last 3 Encounters:  12/25/18 126/69  09/25/18 129/73  09/04/18 118/85   Obesity: BP controlled and normal glucose. Elevated LDL and total cholesterol. Discussed need for weight loss through heart healthy diet and daily exercise.  Observations/Objective: Physical Exam  Constitutional: She is oriented to person, place, and time.  Neurological: She is alert and oriented to person, place, and time.  limited due to telephone appt.  Assessment and Plan: [name] was seen today for follow-up.  Diagnoses and all orders for this visit:  HTN, goal below 140/80 -     HTN_1 BMP; Future -     hydrochlorothiazide (MICROZIDE) 12.5 MG capsule; Take 1 capsule (12.5 mg total) by mouth daily.  Class 1 obesity due to excess calories with serious comorbidity and body mass index (BMI) of 33.0 to 33.9 in adult  Vasomotor symptoms due to menopause   Follow Up Instructions: See avs   I discussed the assessment and treatment plan with the patient. The patient was provided an  opportunity to ask questions and all were answered. The patient agreed with the plan and demonstrated an understanding of the instructions.   The patient was advised to call back or seek an in-person evaluation if the symptoms worsen or if the condition fails to improve as anticipated.   Wilfred Lacy, NP

## 2018-12-26 ENCOUNTER — Encounter: Payer: Self-pay | Admitting: Nurse Practitioner

## 2018-12-26 DIAGNOSIS — N951 Menopausal and female climacteric states: Secondary | ICD-10-CM | POA: Insufficient documentation

## 2018-12-26 NOTE — Patient Instructions (Signed)
Go to lab for blood draw. Continue current medication

## 2018-12-26 NOTE — Assessment & Plan Note (Signed)
probably exacerbated by weight She declines need for effexor at this time.

## 2018-12-26 NOTE — Assessment & Plan Note (Signed)
BP controlled and normal glucose. Elevated LDL and total cholesterol. Discussed need for weight loss through heart healthy diet and daily exercise. She verbalized understanding

## 2018-12-26 NOTE — Assessment & Plan Note (Signed)
Controlled with HCTZ. Denies any adverse side effects. BP Readings from Last 3 Encounters:  12/25/18 126/69  09/25/18 129/73  09/04/18 118/85

## 2019-01-01 ENCOUNTER — Other Ambulatory Visit: Payer: Self-pay

## 2019-01-01 ENCOUNTER — Other Ambulatory Visit (INDEPENDENT_AMBULATORY_CARE_PROVIDER_SITE_OTHER): Payer: BC Managed Care – PPO

## 2019-01-01 DIAGNOSIS — I10 Essential (primary) hypertension: Secondary | ICD-10-CM | POA: Diagnosis not present

## 2019-01-01 LAB — BASIC METABOLIC PANEL
BUN: 12 mg/dL (ref 6–23)
CO2: 27 mEq/L (ref 19–32)
Calcium: 9 mg/dL (ref 8.4–10.5)
Chloride: 107 mEq/L (ref 96–112)
Creatinine, Ser: 0.88 mg/dL (ref 0.40–1.20)
GFR: 81.38 mL/min (ref >60.00)
Glucose, Bld: 86 mg/dL (ref 70–99)
Potassium: 3.6 mEq/L (ref 3.5–5.1)
Sodium: 142 mEq/L (ref 135–145)

## 2019-02-12 ENCOUNTER — Encounter: Payer: Self-pay | Admitting: Nurse Practitioner

## 2019-05-31 ENCOUNTER — Ambulatory Visit: Payer: BC Managed Care – PPO | Attending: Internal Medicine

## 2019-05-31 ENCOUNTER — Ambulatory Visit: Payer: BC Managed Care – PPO

## 2019-05-31 DIAGNOSIS — Z23 Encounter for immunization: Secondary | ICD-10-CM

## 2019-05-31 NOTE — Progress Notes (Signed)
   Covid-19 Vaccination Clinic  Name:  Mariah Dean    MRN: [id number] DOB: 12-Apr-1965  05/31/2019  Mariah Dean was observed post Covid-19 immunization for 15 minutes without incident. She was provided with Vaccine Information Sheet and instruction to access the V-Safe system.   Mariah Dean was instructed to call 911 with any severe reactions post vaccine: Marland Kitchen Difficulty breathing  . Swelling of face and throat  . A fast heartbeat  . A bad rash all over body  . Dizziness and weakness   Immunizations Administered    Name Date Dose VIS Date Route   Pfizer COVID-19 Vaccine 05/31/2019 11:35 AM 0.3 mL 03/10/2018 Intramuscular   Manufacturer: [employer], [employer]   Lot: [id number]   NDC: [address]-4

## 2019-06-07 ENCOUNTER — Encounter: Payer: Self-pay | Admitting: Nurse Practitioner

## 2019-06-07 DIAGNOSIS — J4541 Moderate persistent asthma with (acute) exacerbation: Secondary | ICD-10-CM

## 2019-06-07 DIAGNOSIS — J4531 Mild persistent asthma with (acute) exacerbation: Secondary | ICD-10-CM

## 2019-06-08 MED ORDER — ALBUTEROL SULFATE HFA 108 (90 BASE) MCG/ACT IN AERS: 1.0000 | INHALATION_SPRAY | Freq: Four times a day (QID) | RESPIRATORY_TRACT | 1 refills | Status: AC | PRN

## 2019-06-08 MED ORDER — FLUTICASONE-SALMETEROL 500-50 MCG/DOSE IN AEPB: 1.0000 | INHALATION_SPRAY | Freq: Two times a day (BID) | RESPIRATORY_TRACT | 2 refills | Status: AC

## 2019-06-21 ENCOUNTER — Ambulatory Visit: Payer: BC Managed Care – PPO | Attending: Internal Medicine

## 2019-06-21 DIAGNOSIS — Z23 Encounter for immunization: Secondary | ICD-10-CM

## 2019-06-21 NOTE — Progress Notes (Signed)
   Covid-19 Vaccination Clinic  Name:  Paizlie Klaus    MRN: [id number] DOB: September 30, 1965  06/21/2019  Ms. Lane-McCombs was observed post Covid-19 immunization for 15 minutes without incident. She was provided with Vaccine Information Sheet and instruction to access the V-Safe system.   Ms. Gleghorn was instructed to call 911 with any severe reactions post vaccine: Marland Kitchen Difficulty breathing  . Swelling of face and throat  . A fast heartbeat  . A bad rash all over body  . Dizziness and weakness   Immunizations Administered    Name Date Dose VIS Date Route   Pfizer COVID-19 Vaccine 06/21/2019 12:10 PM 0.3 mL 03/10/2018 Intramuscular   Manufacturer: [employer], [employer]   Lot: [id number]   NDC: [address]-1

## 2019-08-21 ENCOUNTER — Encounter: Payer: Self-pay | Admitting: Nurse Practitioner

## 2019-08-27 ENCOUNTER — Encounter: Payer: BC Managed Care – PPO | Admitting: Nurse Practitioner

## 2019-08-30 ENCOUNTER — Encounter: Payer: Self-pay | Admitting: Nurse Practitioner

## 2019-08-30 ENCOUNTER — Ambulatory Visit (INDEPENDENT_AMBULATORY_CARE_PROVIDER_SITE_OTHER): Payer: BC Managed Care – PPO | Admitting: Nurse Practitioner

## 2019-08-30 ENCOUNTER — Other Ambulatory Visit: Payer: Self-pay

## 2019-08-30 VITALS — BP 110/80 | HR 70 | Temp 98.0°F | Ht 68.5 in | Wt 224.6 lb

## 2019-08-30 DIAGNOSIS — Z0000 Encounter for general adult medical examination without abnormal findings: Secondary | ICD-10-CM | POA: Diagnosis not present

## 2019-08-30 DIAGNOSIS — E7800 Pure hypercholesterolemia, unspecified: Secondary | ICD-10-CM | POA: Insufficient documentation

## 2019-08-30 DIAGNOSIS — Z6833 Body mass index (BMI) 33.0-33.9, adult: Secondary | ICD-10-CM

## 2019-08-30 DIAGNOSIS — E6609 Other obesity due to excess calories: Secondary | ICD-10-CM

## 2019-08-30 DIAGNOSIS — I10 Essential (primary) hypertension: Secondary | ICD-10-CM

## 2019-08-30 DIAGNOSIS — J4530 Mild persistent asthma, uncomplicated: Secondary | ICD-10-CM | POA: Diagnosis not present

## 2019-08-30 DIAGNOSIS — Z0001 Encounter for general adult medical examination with abnormal findings: Secondary | ICD-10-CM

## 2019-08-30 LAB — COMPREHENSIVE METABOLIC PANEL
ALT: 11 U/L (ref 0–35)
AST: 15 U/L (ref 0–37)
Albumin: 4.2 g/dL (ref 3.5–5.2)
Alkaline Phosphatase: 67 U/L (ref 39–117)
BUN: 10 mg/dL (ref 6–23)
CO2: 27 mEq/L (ref 19–32)
Calcium: 9.2 mg/dL (ref 8.4–10.5)
Chloride: 107 mEq/L (ref 96–112)
Creatinine, Ser: 0.88 mg/dL (ref 0.40–1.20)
GFR: 81.18 mL/min (ref >60.00)
Glucose, Bld: 86 mg/dL (ref 70–99)
Potassium: 3.8 mEq/L (ref 3.5–5.1)
Sodium: 142 mEq/L (ref 135–145)
Total Bilirubin: 0.4 mg/dL (ref 0.2–1.2)
Total Protein: 6.7 g/dL (ref 6.0–8.3)

## 2019-08-30 LAB — CBC WITH DIFFERENTIAL/PLATELET
Basophils Absolute: 0 10*3/uL (ref 0.0–0.1)
Basophils Relative: 0.6 % (ref 0.0–3.0)
Eosinophils Absolute: 0.1 10*3/uL (ref 0.0–0.7)
Eosinophils Relative: 3.3 % (ref 0.0–5.0)
HCT: 37.3 % (ref 36.0–46.0)
Hemoglobin: 11.8 g/dL — ABNORMAL LOW (ref 12.0–15.0)
Lymphocytes Relative: 39.8 % (ref 12.0–46.0)
Lymphs Abs: 1.7 10*3/uL (ref 0.7–4.0)
MCHC: 31.6 g/dL (ref 30.0–36.0)
MCV: 77.6 fl — ABNORMAL LOW (ref 78.0–100.0)
Monocytes Absolute: 0.3 10*3/uL (ref 0.1–1.0)
Monocytes Relative: 6.8 % (ref 3.0–12.0)
Neutro Abs: 2.2 10*3/uL (ref 1.4–7.7)
Neutrophils Relative %: 49.5 % (ref 43.0–77.0)
Platelets: 234 10*3/uL (ref 150.0–400.0)
RBC: 4.81 Mil/uL (ref 3.87–5.11)
RDW: 16.7 % — ABNORMAL HIGH (ref 11.5–15.5)
WBC: 4.4 10*3/uL (ref 4.0–10.5)

## 2019-08-30 LAB — LIPID PANEL
Cholesterol: 199 mg/dL (ref 0–200)
HDL: 49.7 mg/dL (ref >39.00)
LDL Cholesterol: 129 mg/dL — ABNORMAL HIGH (ref 0–99)
NonHDL: 149.25
Total CHOL/HDL Ratio: 4
Triglycerides: 103 mg/dL (ref 0.0–149.0)
VLDL: 20.6 mg/dL (ref 0.0–40.0)

## 2019-08-30 LAB — TSH: TSH: 1.91 u[IU]/mL (ref 0.35–4.50)

## 2019-08-30 NOTE — Progress Notes (Signed)
Subjective:    Patient ID: Mariah Dean, female    DOB: 28-Dec-1965, 54 y.o.   MRN: [id number]  Patient presents today for CPE and eval of chronic conditions  HPI Asthma Use of albuterol 3x/week due to SOB and wheezing Triggers: stress and change in temperature. She is not use advair as prescribed.  Spirometry ordered. Resume advair maintain albuterol F/up in 3months  HTN (hypertension), benign BP at goal with HCTZ BP Readings from Last 3 Encounters:  08/30/19 110/80  12/25/18 126/69  09/25/18 129/73   Repeat BMP Maintain current medication  Class 1 obesity due to excess calories with serious comorbidity and body mass index (BMI) of 33.0 to 33.9 in adult Advised about need for weight loss through heart healthy diet and daily exercise. Advised about possible complications from obesity: diabetes, OA, CAD, decreased mobility, uncontrolled asthma.   Sexual History (orientation,birth control, marital status, STD):up to date with pelvic and breast exam  Depression/Suicide: Depression screen [facility] 2/9 08/30/2019 03/20/2018  Decreased Interest 0 0  Down, Depressed, Hopeless 0 0  PHQ - 2 Score 0 0   Vision:up to date  Dental:up to date  Immunizations: (TDAP, Hep C screen, Pneumovax, Influenza, zoster)  Health Maintenance  Topic Date Due   Flu Shot  08/15/2019    Hepatitis C: One time screening is recommended by Center for Disease Control  (CDC) for  adults born from [birth year]1945 through 1965.   08/29/2020*   HIV Screening  08/29/2020*   Colon Cancer Screening  09/04/2019   Mammogram  11/18/2020   Pap Smear  08/25/2021   Tetanus Vaccine  06/14/2024   COVID-19 Vaccine  Completed  *Topic was postponed. The date shown is not the original due date.   Weight:  Wt Readings from Last 3 Encounters:  08/30/19 224 lb 9.6 oz (101.9 kg)  12/25/18 227 lb (103 kg)  09/04/18 225 lb (102.1 kg)   Fall Risk: Fall Risk  03/20/2018  Falls in the past year? 0   Medications and  allergies reviewed with patient and updated if appropriate.  Patient Active Problem List   Diagnosis Date Noted   Pure hypercholesterolemia 08/30/2019   Vasomotor symptoms due to menopause 12/26/2018   Class 1 obesity due to excess calories with serious comorbidity and body mass index (BMI) of 33.0 to 33.9 in adult 12/25/2018   Genetic testing 09/08/2018   Family history of ovarian cancer    Family history of colon cancer - half-brother (20's), uncle in 2950's    Family history of breast cancer    HTN (hypertension), benign 05/20/2018   Asthma 03/20/2018   S/P partial hysterectomy 03/20/2018   Family hx of ovarian malignancy 03/20/2018    Current Outpatient Medications on File Prior to Visit  Medication Sig Dispense Refill   albuterol (VENTOLIN HFA) 108 (90 Base) MCG/ACT inhaler Inhale 1-2 puffs into the lungs every 6 (six) hours as needed for wheezing or shortness of breath. 18 g 1   Fluticasone-Salmeterol (ADVAIR) 500-50 MCG/DOSE AEPB Inhale 1 puff into the lungs 2 (two) times daily. 60 each 2   hydrochlorothiazide (MICROZIDE) 12.5 MG capsule Take 1 capsule (12.5 mg total) by mouth daily. 90 capsule 2   No current facility-administered medications on file prior to visit.    Past Medical History:  Diagnosis Date   Anemia    Asthma    Family history of breast cancer    Family history of colon cancer    Family history of ovarian cancer  GERD (gastroesophageal reflux disease)    heart burn alot and uses OTC remedies    Hyperlipidemia    Hypertension     Past Surgical History:  Procedure Laterality Date   ABDOMINAL HYSTERECTOMY     2010, ovaries and cervix present   BREAST LUMPECTOMY Right    BREAST SURGERY     Biopsy 1994    Social History   Socioeconomic History   Marital status: Married    Spouse name: Not on file   Number of children: Not on file   Years of education: Not on file   Highest education level: Not on file  Occupational  History   Not on file  Tobacco Use   Smoking status: Never Smoker   Smokeless tobacco: Never Used  Vaping Use   Vaping Use: Never used  Substance and Sexual Activity   Alcohol use: Never   Drug use: Never   Sexual activity: Not on file  Other Topics Concern   Not on file  Social History Narrative   Not on file   Social Determinants of Health   Financial Resource Strain:    Difficulty of Paying Living Expenses: Not on file  Food Insecurity:    Worried About Running Out of Food in the Last Year: Not on file   Ran Out of Food in the Last Year: Not on file  Transportation Needs:    Lack of Transportation (Medical): Not on file   Lack of Transportation (Non-Medical): Not on file  Physical Activity:    Days of Exercise per Week: Not on file   Minutes of Exercise per Session: Not on file  Stress:    Feeling of Stress : Not on file  Social Connections:    Frequency of Communication with Friends and Family: Not on file   Frequency of Social Gatherings with Friends and Family: Not on file   Attends Religious Services: Not on file   Active Member of Clubs or Organizations: Not on file   Attends Banker Meetings: Not on file   Marital Status: Not on file    Family History  Problem Relation Age of Onset   Cancer Mother 20       Ovarian    Ovarian cancer Mother    COPD Father    Miscarriages / [place] Daughter    Diabetes Maternal Grandmother    Hypertension Maternal Grandmother    Arthritis Maternal Grandmother    Breast cancer Maternal Aunt        dx under 50   Colon polyps Maternal Aunt    Colon cancer Maternal Uncle        d. 60s   Colon cancer Half-Brother        dx in his 73s   Cancer Maternal Aunt        unknown cancer dx under 50   Cancer Cousin        unknown cancer dx in his 65s   Cancer Maternal Uncle        unknown cancer   Esophageal cancer Neg Hx    Rectal cancer Neg Hx    Stomach cancer Neg Hx         Review of Systems  Constitutional: Negative for fever, malaise/fatigue and weight loss.  HENT: Negative for congestion and sore throat.   Eyes:       Negative for visual changes  Respiratory: Negative for cough and shortness of breath.   Cardiovascular: Negative for chest pain, palpitations and leg  swelling.  Gastrointestinal: Negative for blood in stool, constipation, diarrhea and heartburn.  Genitourinary: Negative for dysuria, frequency and urgency.  Musculoskeletal: Negative for falls, joint pain and myalgias.  Skin: Negative for rash.  Neurological: Negative for dizziness, sensory change and headaches.  Endo/Heme/Allergies: Does not bruise/bleed easily.  Psychiatric/Behavioral: Negative for depression, substance abuse and suicidal ideas. The patient is not nervous/anxious.     Objective:   Vitals:   08/30/19 1129  BP: 110/80  Pulse: 70  Temp: 98 F (36.7 C)  SpO2: 98%    Body mass index is 33.65 kg/m.   Physical Examination:  Physical Exam Vitals reviewed.  Constitutional:      Appearance: She is obese.  HENT:     Right Ear: Tympanic membrane, ear canal and external ear normal.     Left Ear: Tympanic membrane, ear canal and external ear normal.  Eyes:     Extraocular Movements: Extraocular movements intact.     Conjunctiva/sclera: Conjunctivae normal.  Cardiovascular:     Rate and Rhythm: Normal rate and regular rhythm.     Pulses: Normal pulses.     Heart sounds: Normal heart sounds.  Pulmonary:     Effort: Pulmonary effort is normal.     Breath sounds: Normal breath sounds.  Abdominal:     Palpations: Abdomen is soft.     Tenderness: There is no abdominal tenderness.  Genitourinary:    Comments: Breast and pelvic exam Deferred to next year Musculoskeletal:     Cervical back: Normal range of motion and neck supple.     Right lower leg: No edema.     Left lower leg: No edema.  Lymphadenopathy:     Cervical: No cervical adenopathy.  Skin:     General: Skin is warm and dry.  Neurological:     Mental Status: She is alert and oriented to person, place, and time.  Psychiatric:        Mood and Affect: Mood normal.        Behavior: Behavior normal.        Thought Content: Thought content normal.    ASSESSMENT and PLAN: This visit occurred during the SARS-CoV-2 public health emergency.  Safety protocols were in place, including screening questions prior to the visit, additional usage of staff PPE, and extensive cleaning of exam room while observing appropriate contact time as indicated for disinfecting solutions.   Anapaula was seen today for annual exam.  Diagnoses and all orders for this visit:  Encounter for preventative adult health care exam with abnormal findings -     CBC with Differential/Platelet -     Comprehensive metabolic panel -     TSH  HTN (hypertension), benign  Mild persistent asthma without complication -     Spirometry with Graph; Future  Pure hypercholesterolemia -     Lipid panel  Class 1 obesity due to excess calories with serious comorbidity and body mass index (BMI) of 33.0 to 33.9 in adult      Problem List Items Addressed This Visit      Cardiovascular and Mediastinum   HTN (hypertension), benign    BP at goal with HCTZ BP Readings from Last 3 Encounters:  08/30/19 110/80  12/25/18 126/69  09/25/18 129/73   Repeat BMP Maintain current medication        Respiratory   Asthma    Use of albuterol 3x/week due to SOB and wheezing Triggers: stress and change in temperature. She is not use advair as prescribed.  Spirometry ordered. Resume advair maintain albuterol F/up in 41months      Relevant Orders   Spirometry with Graph     Other   Class 1 obesity due to excess calories with serious comorbidity and body mass index (BMI) of 33.0 to 33.9 in adult    Advised about need for weight loss through heart healthy diet and daily exercise. Advised about possible complications from  obesity: diabetes, OA, CAD, decreased mobility, uncontrolled asthma.       Pure hypercholesterolemia   Relevant Orders   Lipid panel (Completed)    Other Visit Diagnoses    Encounter for preventative adult health care exam with abnormal findings    -  Primary   Relevant Orders   CBC with Differential/Platelet (Completed)   Comprehensive metabolic panel (Completed)   TSH (Completed)      Follow up: Return in about 3 months (around 11/30/2019) for Asthma and HTN (video or F2F).  Alysia Penna, NP

## 2019-08-30 NOTE — Assessment & Plan Note (Signed)
Use of albuterol 3x/week due to SOB and wheezing Triggers: stress and change in temperature. She is not use advair as prescribed.  Spirometry ordered. Resume advair maintain albuterol F/up in 31months

## 2019-08-30 NOTE — Assessment & Plan Note (Signed)
Advised about need for weight loss through heart healthy diet and daily exercise. Advised about possible complications from obesity: diabetes, OA, CAD, decreased mobility, uncontrolled asthma.

## 2019-08-30 NOTE — Patient Instructions (Addendum)
You will be contacted to schedule appt for spirometry.  Start advair inhaler as prescribed Use albuterol prn.  Need to start daily exercise and maintain DASH diet.  You will be informed once your form is completed and faxed. No change in number of days  Go to lab for blood draw.   Preventive Care 42-53 Years Old, Female Preventive care refers to visits with your health care provider and lifestyle choices that can promote health and wellness. This includes:  A yearly physical exam. This may also be called an annual well check.  Regular dental visits and eye exams.  Immunizations.  Screening for certain conditions.  Healthy lifestyle choices, such as eating a healthy diet, getting regular exercise, not using drugs or products that contain nicotine and tobacco, and limiting alcohol use. What can I expect for my preventive care visit? Physical exam Your health care provider will check your:  Height and weight. This may be used to calculate body mass index (BMI), which tells if you are at a healthy weight.  Heart rate and blood pressure.  Skin for abnormal spots. Counseling Your health care provider may ask you questions about your:  Alcohol, tobacco, and drug use.  Emotional well-being.  Home and relationship well-being.  Sexual activity.  Eating habits.  Work and work Statistician.  Method of birth control.  Menstrual cycle.  Pregnancy history. What immunizations do I need?  Influenza (flu) vaccine  This is recommended every year. Tetanus, diphtheria, and pertussis (Tdap) vaccine  You may need a Td booster every 10 years. Varicella (chickenpox) vaccine  You may need this if you have not been vaccinated. Zoster (shingles) vaccine  You may need this after age 73. Measles, mumps, and rubella (MMR) vaccine  You may need at least one dose of MMR if you were born in 1957 or later. You may also need a second dose. Pneumococcal conjugate (PCV13)  vaccine  You may need this if you have certain conditions and were not previously vaccinated. Pneumococcal polysaccharide (PPSV23) vaccine  You may need one or two doses if you smoke cigarettes or if you have certain conditions. Meningococcal conjugate (MenACWY) vaccine  You may need this if you have certain conditions. Hepatitis A vaccine  You may need this if you have certain conditions or if you travel or work in places where you may be exposed to hepatitis A. Hepatitis B vaccine  You may need this if you have certain conditions or if you travel or work in places where you may be exposed to hepatitis B. Haemophilus influenzae type b (Hib) vaccine  You may need this if you have certain conditions. Human papillomavirus (HPV) vaccine  If recommended by your health care provider, you may need three doses over 6 months. You may receive vaccines as individual doses or as more than one vaccine together in one shot (combination vaccines). Talk with your health care provider about the risks and benefits of combination vaccines. What tests do I need? Blood tests  Lipid and cholesterol levels. These may be checked every 5 years, or more frequently if you are over 78 years old.  Hepatitis C test.  Hepatitis B test. Screening  Lung cancer screening. You may have this screening every year starting at age 76 if you have a 30-pack-year history of smoking and currently smoke or have quit within the past 15 years.  Colorectal cancer screening. All adults should have this screening starting at age 84 and continuing until age 100. Your health care  provider may recommend screening at age 28 if you are at increased risk. You will have tests every 1-10 years, depending on your results and the type of screening test.  Diabetes screening. This is done by checking your blood sugar (glucose) after you have not eaten for a while (fasting). You may have this done every 1-3 years.  Mammogram. This may be  done every 1-2 years. Talk with your health care provider about when you should start having regular mammograms. This may depend on whether you have a family history of breast cancer.  BRCA-related cancer screening. This may be done if you have a family history of breast, ovarian, tubal, or peritoneal cancers.  Pelvic exam and Pap test. This may be done every 3 years starting at age 50. Starting at age 53, this may be done every 5 years if you have a Pap test in combination with an HPV test. Other tests  Sexually transmitted disease (STD) testing.  Bone density scan. This is done to screen for osteoporosis. You may have this scan if you are at high risk for osteoporosis. Follow these instructions at home: Eating and drinking  Eat a diet that includes fresh fruits and vegetables, whole grains, lean protein, and low-fat dairy.  Take vitamin and mineral supplements as recommended by your health care provider.  Do not drink alcohol if: ? Your health care provider tells you not to drink. ? You are pregnant, may be pregnant, or are planning to become pregnant.  If you drink alcohol: ? Limit how much you have to 0-1 drink a day. ? Be aware of how much alcohol is in your drink. In the [place] one drink equals one 12 oz bottle of beer (355 mL), one 5 oz glass of wine (148 mL), or one 1 oz glass of hard liquor (44 mL). Lifestyle  Take daily care of your teeth and gums.  Stay active. Exercise for at least 30 minutes on 5 or more days each week.  Do not use any products that contain nicotine or tobacco, such as cigarettes, e-cigarettes, and chewing tobacco. If you need help quitting, ask your health care provider.  If you are sexually active, practice safe sex. Use a condom or other form of birth control (contraception) in order to prevent pregnancy and STIs (sexually transmitted infections).  If told by your health care provider, take low-dose aspirin daily starting at age 42. What's  next?  Visit your health care provider once a year for a well check visit.  Ask your health care provider how often you should have your eyes and teeth checked.  Stay up to date on all vaccines. This information is not intended to replace advice given to you by your health care provider. Make sure you discuss any questions you have with your health care provider. Document Revised: 09/11/2017 Document Reviewed: 09/11/2017 Elsevier Patient Education  2020 [employer].

## 2019-08-30 NOTE — Assessment & Plan Note (Signed)
BP at goal with HCTZ BP Readings from Last 3 Encounters:  08/30/19 110/80  12/25/18 126/69  09/25/18 129/73   Repeat BMP Maintain current medication

## 2019-09-07 ENCOUNTER — Encounter: Payer: Self-pay | Admitting: Nurse Practitioner

## 2019-09-08 NOTE — Telephone Encounter (Signed)
Forms completed and faxed on 09/08/2019 Completed forms sent to be scanned and a copy has been placed at my desk in orange folder in top drawer.

## 2019-10-18 ENCOUNTER — Other Ambulatory Visit: Payer: Self-pay

## 2019-10-18 ENCOUNTER — Encounter: Payer: Self-pay | Admitting: Nurse Practitioner

## 2019-10-18 DIAGNOSIS — I10 Essential (primary) hypertension: Secondary | ICD-10-CM

## 2019-10-18 MED ORDER — HYDROCHLOROTHIAZIDE 12.5 MG PO CAPS: 12.5000 mg | ORAL_CAPSULE | Freq: Every day | ORAL | 2 refills | Status: AC

## 2019-10-18 NOTE — Telephone Encounter (Signed)
Rx last filled 12/25/18  Quantity 90 with 2 refills Chart supports use of Rx

## 2019-10-22 ENCOUNTER — Other Ambulatory Visit: Payer: Self-pay | Admitting: Nurse Practitioner

## 2019-10-22 DIAGNOSIS — Z0000 Encounter for general adult medical examination without abnormal findings: Secondary | ICD-10-CM

## 2019-11-05 ENCOUNTER — Encounter: Payer: Self-pay | Admitting: Nurse Practitioner

## 2019-11-26 ENCOUNTER — Ambulatory Visit
Admission: RE | Admit: 2019-11-26 | Discharge: 2019-11-26 | Disposition: A | Discharge status: Home / Self Care | Payer: BC Managed Care – PPO | Source: Ambulatory Visit | Attending: Nurse Practitioner | Admitting: Nurse Practitioner

## 2019-11-26 ENCOUNTER — Other Ambulatory Visit: Payer: Self-pay

## 2019-11-26 DIAGNOSIS — Z0000 Encounter for general adult medical examination without abnormal findings: Secondary | ICD-10-CM

## 2020-01-17 ENCOUNTER — Encounter: Payer: Self-pay | Admitting: Nurse Practitioner

## 2020-04-07 ENCOUNTER — Encounter: Payer: Self-pay | Admitting: Nurse Practitioner

## 2020-04-21 ENCOUNTER — Encounter: Payer: Self-pay | Admitting: Nurse Practitioner

## 2020-08-20 ENCOUNTER — Other Ambulatory Visit: Payer: Self-pay | Admitting: Nurse Practitioner

## 2020-08-20 DIAGNOSIS — I10 Essential (primary) hypertension: Secondary | ICD-10-CM

## 2020-08-22 NOTE — Telephone Encounter (Signed)
Pt needs appointment before rx refills Medication: Hydrochlorothiazide 12.5mg 

## 2020-11-09 ENCOUNTER — Encounter: Payer: Self-pay | Admitting: Nurse Practitioner

## 2020-11-22 ENCOUNTER — Ambulatory Visit: Payer: BC Managed Care – PPO | Admitting: Nurse Practitioner

## 2020-12-24 IMAGING — MG DIGITAL SCREENING BILAT W/ TOMO W/ CAD
8 series · 8 of 24 positions shown · non-contrast
Comparison: Previous exam(s).

CLINICAL DATA: Screening.

EXAM:
DIGITAL SCREENING BILATERAL MAMMOGRAM WITH TOMO AND CAD

[R CC synth-2D]
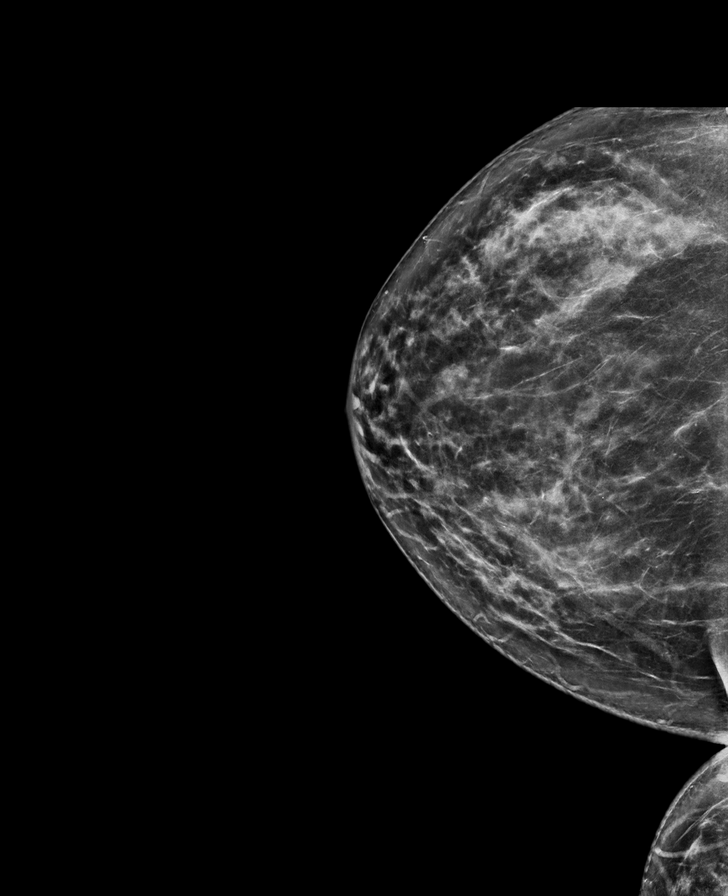

[R MLO synth-2D]
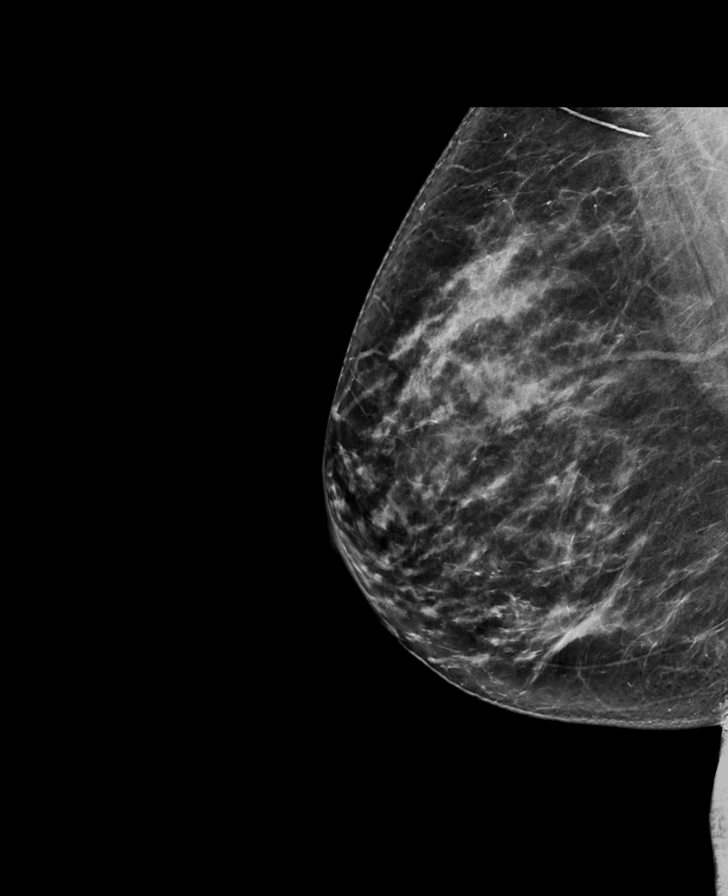

[L MLO synth-2D]
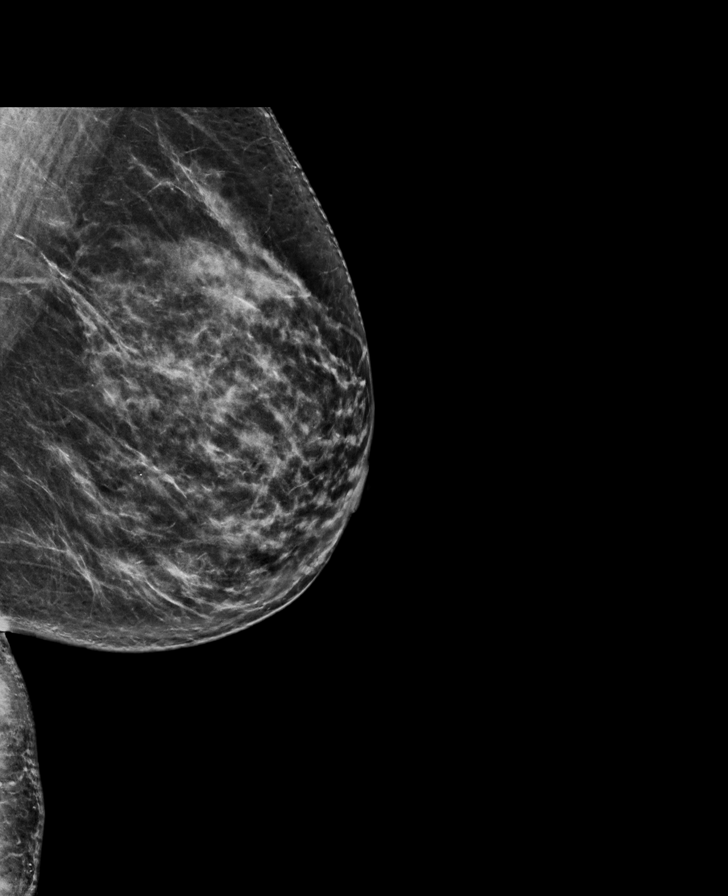

[L CC synth-2D]
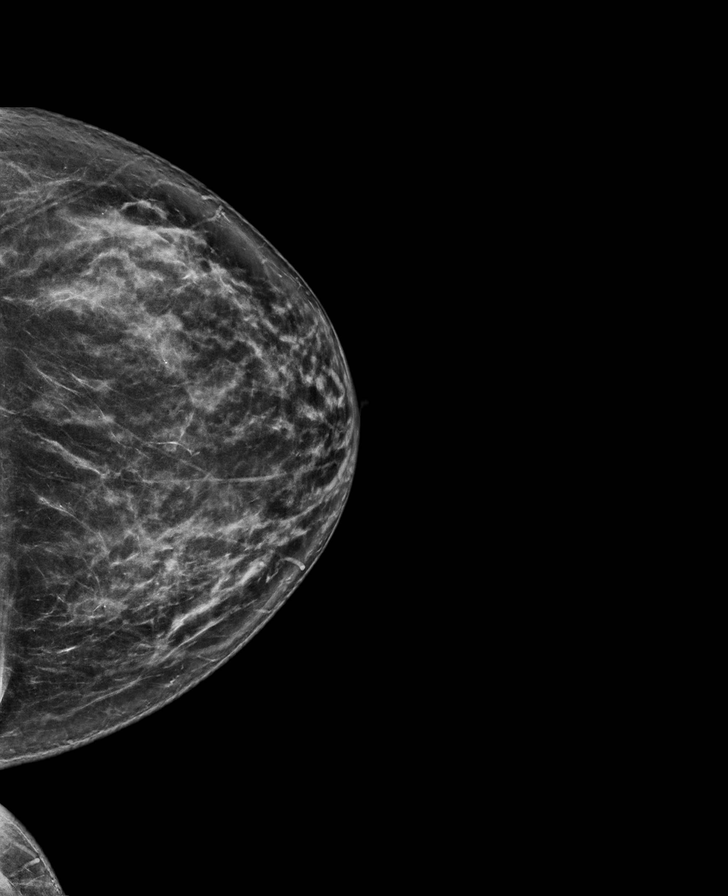

[R CC tomo · tomo slice 43/86.0]
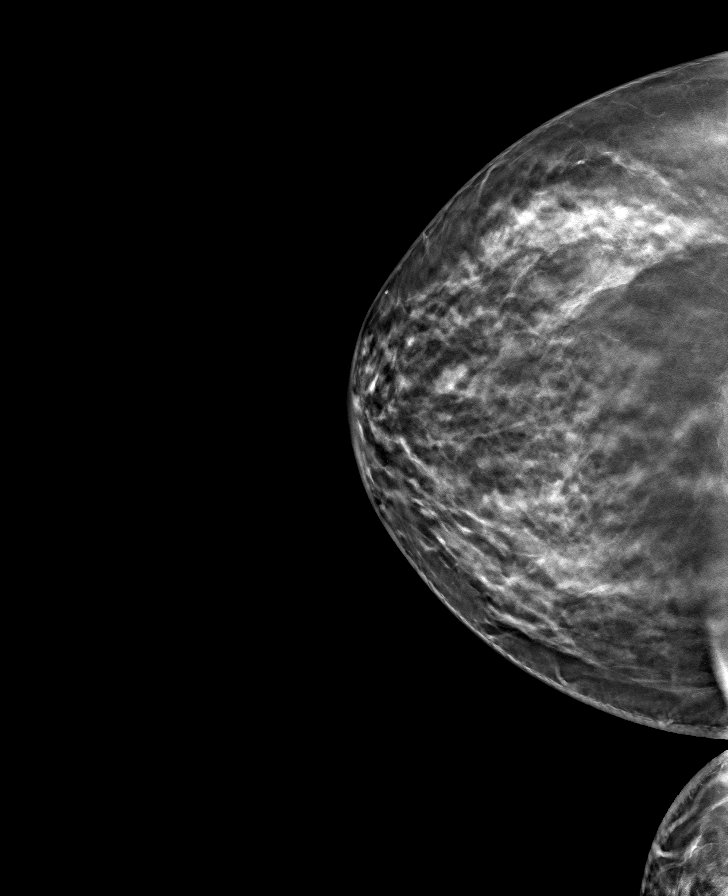

[L MLO tomo · tomo slice 41/82.0]
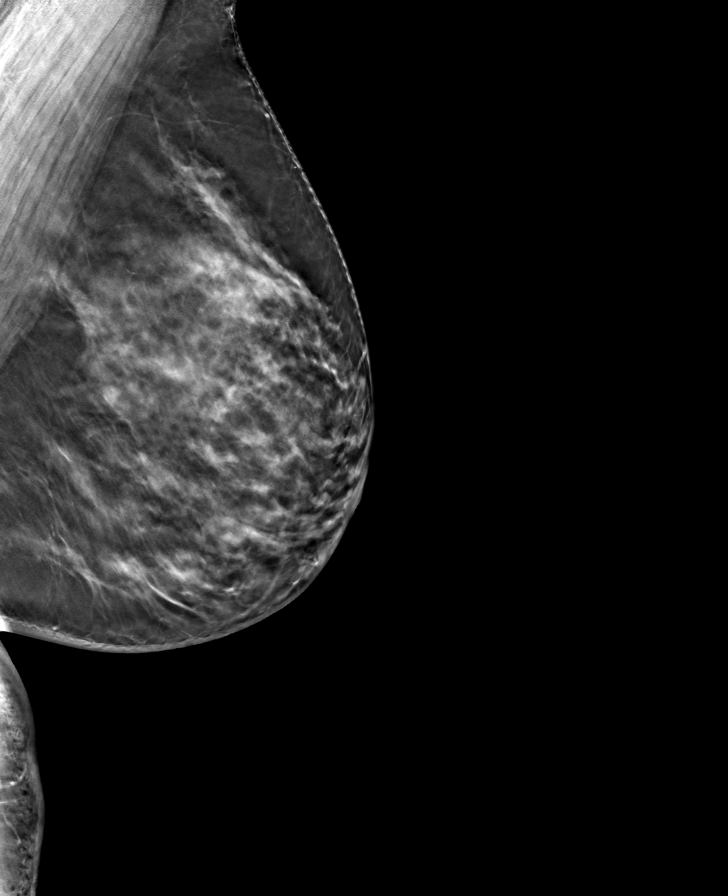

[R MLO tomo · tomo slice 43/85.0]
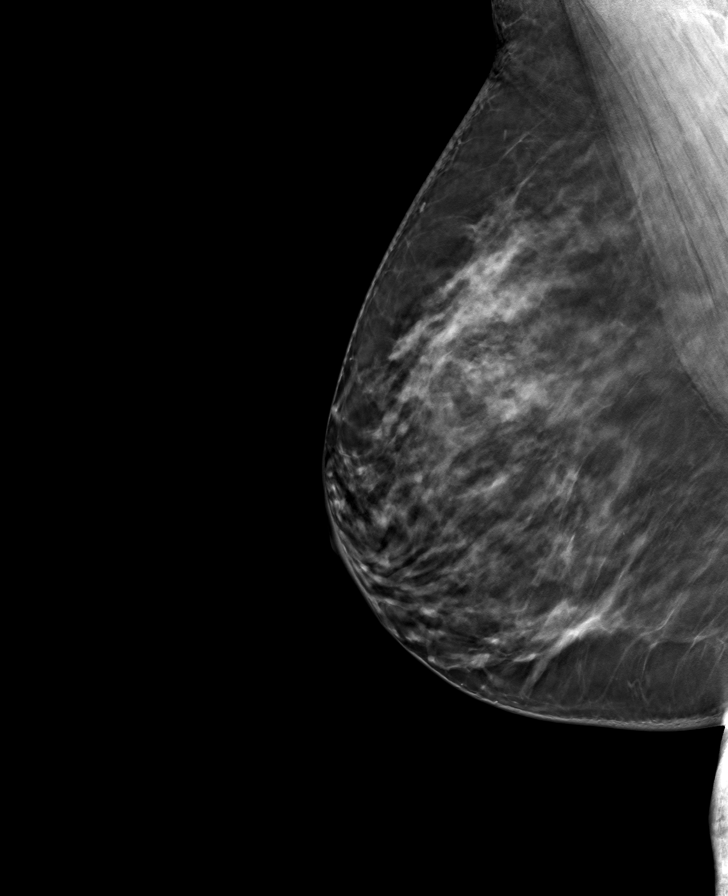

[L CC tomo · tomo slice 41/81.0]
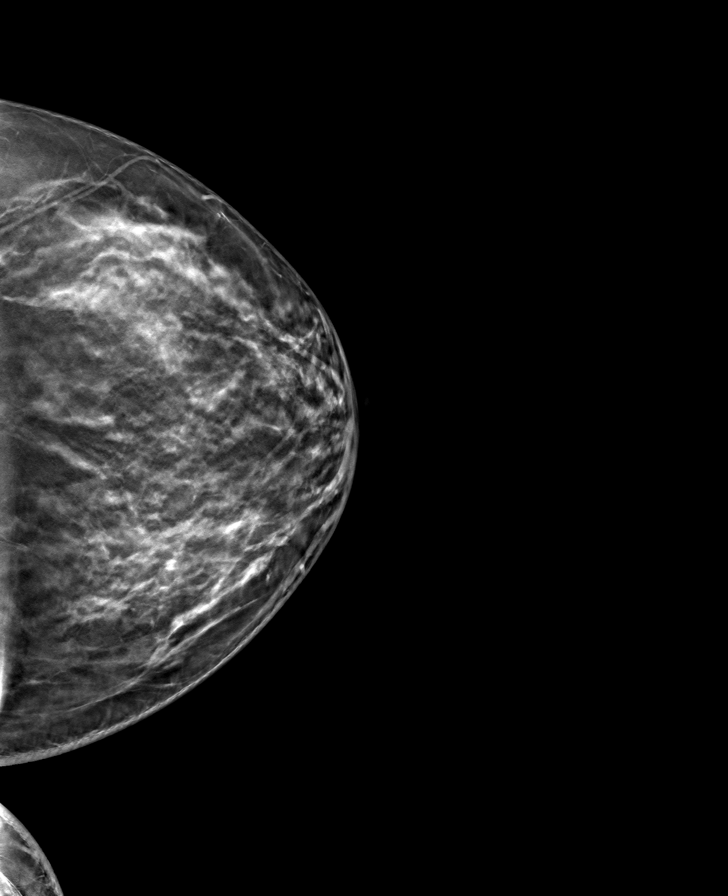

[8 of 24 positions shown; findings below may reference images not displayed]

ACR Breast Density Category c: The breast tissue is heterogeneously
dense, which may obscure small masses.
FINDINGS: There are no findings suspicious for malignancy. Images were
processed with CAD.
IMPRESSION: No mammographic evidence of malignancy. A result letter of this
screening mammogram will be mailed directly to the patient.

RECOMMENDATION:
Screening mammogram in one year. (Code:FT-U-LHB)

BI-RADS CATEGORY  1: Negative.

## 2021-01-11 ENCOUNTER — Encounter: Payer: Self-pay | Admitting: Internal Medicine

## 2021-08-01 ENCOUNTER — Other Ambulatory Visit: Payer: Self-pay

## 2021-08-01 ENCOUNTER — Emergency Department (HOSPITAL_BASED_OUTPATIENT_CLINIC_OR_DEPARTMENT_OTHER)
Admission: EM | Admit: 2021-08-01 | Discharge: 2021-08-01 | Disposition: A | Discharge status: Home / Self Care | Payer: BC Managed Care – PPO | Attending: Emergency Medicine | Admitting: Emergency Medicine

## 2021-08-01 ENCOUNTER — Encounter (HOSPITAL_BASED_OUTPATIENT_CLINIC_OR_DEPARTMENT_OTHER): Payer: Self-pay

## 2021-08-01 DIAGNOSIS — M5412 Radiculopathy, cervical region: Secondary | ICD-10-CM | POA: Diagnosis not present

## 2021-08-01 DIAGNOSIS — Z79899 Other long term (current) drug therapy: Secondary | ICD-10-CM | POA: Diagnosis not present

## 2021-08-01 DIAGNOSIS — R202 Paresthesia of skin: Secondary | ICD-10-CM | POA: Diagnosis present

## 2021-08-01 MED ORDER — PREDNISONE 10 MG (21) PO TBPK: ORAL_TABLET | Freq: Every day | ORAL | 0 refills | Status: AC

## 2021-08-01 NOTE — ED Provider Notes (Signed)
[facility] EMERGENCY DEPT Provider Note   CSN: [id number] Arrival date & time: 08/01/21  1552     History  Chief Complaint  Patient presents with   Neck Pain    Mariah Dean is a 56 y.o. female.  56 year old female presents today for evaluation of paresthesias of bilateral upper extremities.  This has been a longstanding issue.  Was evaluated July of last year by her PCP.  She states that has been ongoing since then.  She also has a ganglion cyst on her left shoulder.  This was also a chronic issue.  Denies any recent changes to her symptoms.  States her concern is the ongoing nature of her symptoms.  Denies chest pain, shortness of breath.  Denies weakness in her upper extremities.  Denies paresthesias of bilateral lower extremities.  Denies any strokelike symptoms.  The history is provided by the patient. No language interpreter was used.       Home Medications Prior to Admission medications   Medication Sig Start Date End Date Taking? Authorizing Provider  albuterol (VENTOLIN HFA) 108 (90 Base) MCG/ACT inhaler Inhale 1-2 puffs into the lungs every 6 (six) hours as needed for wheezing or shortness of breath. 06/08/19   Nche, Bonna Gains, NP  Fluticasone-Salmeterol (ADVAIR) 500-50 MCG/DOSE AEPB Inhale 1 puff into the lungs 2 (two) times daily. 06/08/19   Nche, Bonna Gains, NP  hydrochlorothiazide (MICROZIDE) 12.5 MG capsule Take 1 capsule (12.5 mg total) by mouth daily. 10/18/19   Nche, Bonna Gains, NP      Allergies    Patient has no known allergies.    Review of Systems   Review of Systems  Constitutional:  Negative for chills and fever.  Respiratory:  Negative for shortness of breath.   Cardiovascular:  Negative for chest pain and palpitations.  Gastrointestinal:  Negative for abdominal pain and nausea.  Neurological:  Positive for numbness. Negative for weakness.  All other systems reviewed and are negative.   Physical Exam Updated Vital  Signs BP 134/71   Pulse 73   Temp 98.2 F (36.8 C)   Resp 16   Ht 5' 8.5" (1.74 m)   Wt 101.9 kg   SpO2 100%   BMI 33.66 kg/m  Physical Exam Vitals and nursing note reviewed.  Constitutional:      General: She is not in acute distress.    Appearance: Normal appearance. She is not ill-appearing.  HENT:     Head: Normocephalic and atraumatic.     Nose: Nose normal.  Eyes:     General: No scleral icterus.    Extraocular Movements: Extraocular movements intact.     Conjunctiva/sclera: Conjunctivae normal.  Cardiovascular:     Rate and Rhythm: Normal rate and regular rhythm.     Pulses: Normal pulses.  Pulmonary:     Effort: Pulmonary effort is normal. No respiratory distress.     Breath sounds: Normal breath sounds. No wheezing or rales.  Abdominal:     General: There is no distension.     Tenderness: There is no abdominal tenderness.  Musculoskeletal:        General: Normal range of motion.     Cervical back: Normal range of motion.     Comments: Range of motion bilateral upper extremities.  5/5 strength in bilateral upper extremities.  Sensation intact and symmetrical.  5/5 strength in forage motion of bilateral lower extremities.  Without pronator drift.  Cranial nerves III through XII intact.  Tongue midline.  Skin:  General: Skin is warm and dry.  Neurological:     General: No focal deficit present.     Mental Status: She is alert. Mental status is at baseline.     ED Results / Procedures / Treatments   Labs (all labs ordered are listed, but only abnormal results are displayed) Labs Reviewed - No data to display  EKG None  Radiology No results found.  Procedures Procedures    Medications Ordered in ED Medications - No data to display  ED Course/ Medical Decision Making/ A&P                           Medical Decision Making  56 year old female presents today for evaluation of bilateral upper extremity paresthesias.  This is a chronic, longstanding  issue.  Denies recent change in symptoms.  Does have ganglion cyst on exam to left shoulder.  This is also chronic.  Her only concern is ongoing symptoms and not change or worsening symptoms.  She was evaluated by her PCP regarding the symptoms and July 2022.  Will provide steroid Dosepak.  Return precautions discussed.  Discussed follow-up with PCP.  Likely had cervical radiculopathy.  Patient voices understanding and is in agreement with plan.  Doubt ACS as she has no chest pain, shortness of breath or acute ischemic changes on EKG.   Final Clinical Impression(s) / ED Diagnoses Final diagnoses:  Cervical radiculopathy    Rx / DC Orders ED Discharge Orders          Ordered    predniSONE (STERAPRED UNI-PAK 21 TAB) 10 MG (21) TBPK tablet  Daily        08/01/21 1810              Marita [place], PA-C 08/01/21 1810    Ernie Avena, MD 08/02/21 0000

## 2021-08-01 NOTE — ED Notes (Signed)
Discharge instructions, follow up care, and prescription\ reviewed and explained, pt verbalized understanding. Pt caox4 and ambulatory on departure.  

## 2021-08-01 NOTE — Discharge Instructions (Signed)
Your exam today was overall reassuring.  You likely have cervical radiculopathy.  I have given you a steroid Dosepak.  Please schedule a follow-up appointment with your primary care provider.  If you have any worsening or concerning symptoms please return to the emergency room.  If you develop any chest pain, shortness of breath or other concerning symptoms please return to the emergency room

## 2021-08-01 NOTE — ED Triage Notes (Signed)
Patient here POV from Home.  Endorses Left Lateral Neck Pain that Began Yesterday. Pain Subsided but then returned today. Began to radiate today as Well extending from Left Lateral Neck to Left Wrist.   Also endorses Right Arm Numbness that began approximately 1 week ago. No N/V/D. No Fevers.   NAD Noted during Triage. A&Ox4. GCS 15. Ambulatory.

## 2022-01-03 ENCOUNTER — Other Ambulatory Visit (HOSPITAL_COMMUNITY)
Admission: RE | Admit: 2022-01-03 | Discharge: 2022-01-03 | Disposition: A | Discharge status: Home / Self Care | Payer: BC Managed Care – PPO | Source: Ambulatory Visit | Attending: Family Medicine | Admitting: Family Medicine

## 2022-01-03 ENCOUNTER — Ambulatory Visit (INDEPENDENT_AMBULATORY_CARE_PROVIDER_SITE_OTHER): Payer: BC Managed Care – PPO | Admitting: Obstetrics and Gynecology

## 2022-01-03 ENCOUNTER — Encounter: Payer: Self-pay | Admitting: Obstetrics and Gynecology

## 2022-01-03 ENCOUNTER — Other Ambulatory Visit: Payer: Self-pay

## 2022-01-03 VITALS — BP 126/82 | HR 71 | Ht 69.0 in | Wt 235.1 lb

## 2022-01-03 DIAGNOSIS — N951 Menopausal and female climacteric states: Secondary | ICD-10-CM

## 2022-01-03 DIAGNOSIS — Z90711 Acquired absence of uterus with remaining cervical stump: Secondary | ICD-10-CM

## 2022-01-03 DIAGNOSIS — Z800 Family history of malignant neoplasm of digestive organs: Secondary | ICD-10-CM

## 2022-01-03 DIAGNOSIS — Z1239 Encounter for other screening for malignant neoplasm of breast: Secondary | ICD-10-CM | POA: Diagnosis not present

## 2022-01-03 DIAGNOSIS — Z124 Encounter for screening for malignant neoplasm of cervix: Secondary | ICD-10-CM | POA: Diagnosis present

## 2022-01-03 DIAGNOSIS — Z8041 Family history of malignant neoplasm of ovary: Secondary | ICD-10-CM | POA: Diagnosis not present

## 2022-01-03 MED ORDER — ESTRADIOL 0.05 MG/24HR TD PTWK: 0.0500 mg | MEDICATED_PATCH | TRANSDERMAL | 12 refills | Status: AC

## 2022-01-03 NOTE — Progress Notes (Signed)
GYNECOLOGY ANNUAL PREVENTATIVE CARE ENCOUNTER NOTE  History:     Mariah Dean is a 56 y.o. No obstetric history on file. female here for a routine annual gynecologic exam.  Current complaints: vasomotor symptoms 2/2  menopause.   Denies abnormal vaginal bleeding, discharge, pelvic pain, problems with intercourse or other gynecologic concerns.   Pt is status post hysterectomy in 2010, from exam it appears to be laparoscopic/supracervical.  No large incision on abdomen.   Gynecologic History No LMP recorded. Patient has had a hysterectomy. Contraception: status post hysterectomy Last Pap: 8/20. Results were: normal with negative HPV Last mammogram: 11/21. Results were: normal  Obstetric History OB History  No obstetric history on file.    Past Medical History:  Diagnosis Date   Anemia    Asthma    Family history of breast cancer    Family history of colon cancer    Family history of ovarian cancer    GERD (gastroesophageal reflux disease)    heart burn alot and uses OTC remedies    Hyperlipidemia    Hypertension     Past Surgical History:  Procedure Laterality Date   ABDOMINAL HYSTERECTOMY     2010, ovaries and cervix present   BREAST LUMPECTOMY Right    BREAST SURGERY     Biopsy 1994    Current Outpatient Medications on File Prior to Visit  Medication Sig Dispense Refill   albuterol (VENTOLIN HFA) 108 (90 Base) MCG/ACT inhaler Inhale 1-2 puffs into the lungs every 6 (six) hours as needed for wheezing or shortness of breath. 18 g 1   amLODipine (NORVASC) 5 MG tablet Take by mouth.     Fluticasone-Salmeterol (ADVAIR) 500-50 MCG/DOSE AEPB Inhale 1 puff into the lungs 2 (two) times daily. 60 each 2   hydrochlorothiazide (MICROZIDE) 12.5 MG capsule Take 1 capsule (12.5 mg total) by mouth daily. 90 capsule 2   predniSONE (STERAPRED UNI-PAK 21 TAB) 10 MG (21) TBPK tablet Take by mouth daily. Take 6 tabs by mouth daily  for 2 days, then 5 tabs for 2 days, then 4 tabs  for 2 days, then 3 tabs for 2 days, 2 tabs for 2 days, then 1 tab by mouth daily for 2 days 42 tablet 0   No current facility-administered medications on file prior to visit.    No Known Allergies  Social History:  reports that she has never smoked. She has never used smokeless tobacco. She reports that she does not drink alcohol and does not use drugs.  Family History  Problem Relation Age of Onset   Cancer Mother 8       Ovarian    Ovarian cancer Mother    COPD Father    Miscarriages / Stillbirths Daughter    Diabetes Maternal Grandmother    Hypertension Maternal Grandmother    Arthritis Maternal Grandmother    Breast cancer Maternal Aunt        dx under 50   Colon polyps Maternal Aunt    Colon cancer Maternal Uncle        d. 48s   Colon cancer Half-Brother        dx in his 49s   Cancer Maternal Aunt        unknown cancer dx under 50   Cancer Cousin        unknown cancer dx in his 20s   Cancer Maternal Uncle        unknown cancer   Esophageal cancer Neg Hx  Rectal cancer Neg Hx    Stomach cancer Neg Hx     The following portions of the patient's history were reviewed and updated as appropriate: allergies, current medications, past family history, past medical history, past social history, past surgical history and problem list.  Review of Systems Pertinent items noted in HPI and remainder of comprehensive ROS otherwise negative.  Physical Exam:  BP 126/82   Pulse 71   Ht 5\' 9"  (1.753 m)   Wt 235 lb 1.6 oz (106.6 kg)   BMI 34.72 kg/m  CONSTITUTIONAL: Well-developed, well-nourished female in no acute distress.  HENT:  Normocephalic, atraumatic, External right and left ear normal. Oropharynx is clear and moist EYES: Conjunctivae and EOM are normal.  NECK: Normal range of motion, supple, no masses.  Normal thyroid.  SKIN: Skin is warm and dry. No rash noted. Not diaphoretic. No erythema. No pallor. MUSCULOSKELETAL: Normal range of motion. No tenderness.  No  cyanosis, clubbing, or edema.  2+ distal pulses. NEUROLOGIC: Alert and oriented to person, place, and time. Normal reflexes, muscle tone coordination.  PSYCHIATRIC: Normal mood and affect. Normal behavior. Normal judgment and thought content. CARDIOVASCULAR: Normal heart rate noted, regular rhythm RESPIRATORY: Clear to auscultation bilaterally. Effort and breath sounds normal, no problems with respiration noted. BREASTS: deferred ABDOMEN: Soft, no distention noted.  No tenderness, rebound or guarding.  PELVIC: Normal appearing external genitalia and urethral meatus; normal appearing vaginal mucosa. Cervix is protuberant but also appears to be stenotic.   No abnormal discharge noted.  Pap smear obtained.  Uterus is absent.  Performed in the presence of a chaperone.   Assessment and Plan:    1. Cervical cancer screening  - Cytology - PAP( [facility])  2. Breast screening Pt states she has has mammogram in January through [facility], she will get her next mammogram with [facility] to consolidate care.  3. S/P partial hysterectomy   4. Family history of ovarian cancer   5. Family history of colon cancer - half-brother (20's), uncle in 54's Reviewed genetic counseling notes and test, no increased risk for gene mediated cancer  6. Menopausal vasomotor syndrome Trial of estrogen patch , will reassess in 3 months - estradiol (CLIMARA) 0.05 mg/24hr patch; Place 1 patch (0.05 mg total) onto the skin once a week.  Dispense: 4 patch; Refill: 12  Will follow up results of pap smear and manage accordingly. Mammogram scheduled Routine preventative health maintenance measures emphasized. Please refer to After Visit Summary for other counseling recommendations.     F/u in 3 months for follow up of HRT with virtual visit.  Lynnda Shields, MD, [place] [facility], [place]

## 2022-01-10 LAB — CYTOLOGY - PAP
Comment: NEGATIVE
Diagnosis: NEGATIVE
Diagnosis: REACTIVE
High risk HPV: NEGATIVE

## 2022-02-24 ENCOUNTER — Encounter: Payer: Self-pay | Admitting: Obstetrics and Gynecology

## 2022-02-25 ENCOUNTER — Other Ambulatory Visit: Payer: Self-pay | Admitting: *Deleted

## 2022-02-25 ENCOUNTER — Encounter: Payer: Self-pay | Admitting: Obstetrics and Gynecology

## 2022-02-25 DIAGNOSIS — N951 Menopausal and female climacteric states: Secondary | ICD-10-CM

## 2022-02-25 DIAGNOSIS — Z90711 Acquired absence of uterus with remaining cervical stump: Secondary | ICD-10-CM

## 2022-02-25 MED ORDER — ESTROGENS CONJUGATED 0.45 MG PO TABS: 0.4500 mg | ORAL_TABLET | Freq: Every day | ORAL | 2 refills | Status: AC
